# Patient Record
Sex: Male | Born: 1965 | Race: White | Hispanic: No | Marital: Married | Smoking: Former smoker
Health system: Southern US, Community
[De-identification: ages and names within clinical notes are randomized; demographics above are authoritative.]

## PROBLEM LIST (undated history)

## (undated) DIAGNOSIS — F419 Anxiety disorder, unspecified: Secondary | ICD-10-CM

## (undated) DIAGNOSIS — I10 Essential (primary) hypertension: Secondary | ICD-10-CM

## (undated) DIAGNOSIS — G4733 Obstructive sleep apnea (adult) (pediatric): Secondary | ICD-10-CM

## (undated) DIAGNOSIS — M1990 Unspecified osteoarthritis, unspecified site: Secondary | ICD-10-CM

## (undated) DIAGNOSIS — K219 Gastro-esophageal reflux disease without esophagitis: Secondary | ICD-10-CM

## (undated) DIAGNOSIS — E291 Testicular hypofunction: Secondary | ICD-10-CM

## (undated) DIAGNOSIS — Z973 Presence of spectacles and contact lenses: Secondary | ICD-10-CM

## (undated) DIAGNOSIS — E785 Hyperlipidemia, unspecified: Secondary | ICD-10-CM

## (undated) DIAGNOSIS — R7303 Prediabetes: Secondary | ICD-10-CM

## (undated) DIAGNOSIS — S83207A Unspecified tear of unspecified meniscus, current injury, left knee, initial encounter: Secondary | ICD-10-CM

## (undated) DIAGNOSIS — G43909 Migraine, unspecified, not intractable, without status migrainosus: Secondary | ICD-10-CM

## (undated) DIAGNOSIS — E119 Type 2 diabetes mellitus without complications: Secondary | ICD-10-CM

## (undated) DIAGNOSIS — Z9989 Dependence on other enabling machines and devices: Secondary | ICD-10-CM

## (undated) DIAGNOSIS — M109 Gout, unspecified: Secondary | ICD-10-CM

## (undated) DIAGNOSIS — E7439 Other disorders of intestinal carbohydrate absorption: Secondary | ICD-10-CM

## (undated) HISTORY — DX: Essential (primary) hypertension: I10

## (undated) HISTORY — DX: Gastro-esophageal reflux disease without esophagitis: K21.9

## (undated) HISTORY — DX: Migraine, unspecified, not intractable, without status migrainosus: G43.909

## (undated) HISTORY — PX: OTHER SURGICAL HISTORY: SHX169

## (undated) HISTORY — DX: Testicular hypofunction: E29.1

## (undated) HISTORY — PX: KNEE ARTHROSCOPY: SUR90

## (undated) HISTORY — DX: Hyperlipidemia, unspecified: E78.5

---

## 1988-11-14 HISTORY — PX: KNEE ARTHROSCOPY: SUR90

## 1997-08-22 ENCOUNTER — Emergency Department (HOSPITAL_COMMUNITY): Admission: EM | Admit: 1997-08-22 | Discharge: 1997-08-22 | Payer: Self-pay | Admitting: Emergency Medicine

## 1998-03-16 HISTORY — PX: LUMBAR DISC SURGERY: SHX700

## 1999-10-09 ENCOUNTER — Other Ambulatory Visit: Admission: RE | Admit: 1999-10-09 | Discharge: 1999-10-09 | Payer: Self-pay | Admitting: Otolaryngology

## 1999-10-09 ENCOUNTER — Encounter (INDEPENDENT_AMBULATORY_CARE_PROVIDER_SITE_OTHER): Payer: Self-pay | Admitting: Specialist

## 2001-01-12 ENCOUNTER — Encounter: Admission: RE | Admit: 2001-01-12 | Discharge: 2001-01-12 | Payer: Self-pay | Admitting: Family Medicine

## 2001-01-12 ENCOUNTER — Encounter: Payer: Self-pay | Admitting: Family Medicine

## 2004-01-15 ENCOUNTER — Encounter: Admission: RE | Admit: 2004-01-15 | Discharge: 2004-01-15 | Payer: Self-pay | Admitting: Family Medicine

## 2004-01-17 ENCOUNTER — Encounter (INDEPENDENT_AMBULATORY_CARE_PROVIDER_SITE_OTHER): Payer: Self-pay | Admitting: *Deleted

## 2004-01-17 ENCOUNTER — Observation Stay (HOSPITAL_COMMUNITY): Admission: RE | Admit: 2004-01-17 | Discharge: 2004-01-18 | Payer: Self-pay | Admitting: General Surgery

## 2004-01-17 HISTORY — PX: LAPAROSCOPIC CHOLECYSTECTOMY: SUR755

## 2005-05-22 ENCOUNTER — Emergency Department (HOSPITAL_COMMUNITY): Admission: EM | Admit: 2005-05-22 | Discharge: 2005-05-22 | Payer: Self-pay | Admitting: Family Medicine

## 2005-11-24 ENCOUNTER — Ambulatory Visit: Payer: Self-pay | Admitting: Family Medicine

## 2005-11-26 ENCOUNTER — Emergency Department (HOSPITAL_COMMUNITY): Admission: EM | Admit: 2005-11-26 | Discharge: 2005-11-27 | Payer: Self-pay | Admitting: Emergency Medicine

## 2005-11-26 ENCOUNTER — Ambulatory Visit: Payer: Self-pay | Admitting: Family Medicine

## 2006-02-12 ENCOUNTER — Ambulatory Visit: Payer: Self-pay | Admitting: Family Medicine

## 2006-04-22 ENCOUNTER — Ambulatory Visit: Payer: Self-pay | Admitting: Family Medicine

## 2006-05-04 ENCOUNTER — Ambulatory Visit: Payer: Self-pay | Admitting: Family Medicine

## 2006-05-17 ENCOUNTER — Ambulatory Visit: Payer: Self-pay | Admitting: Family Medicine

## 2006-06-22 ENCOUNTER — Ambulatory Visit: Payer: Self-pay | Admitting: Family Medicine

## 2007-01-25 ENCOUNTER — Ambulatory Visit: Payer: Self-pay | Admitting: Family Medicine

## 2007-02-14 ENCOUNTER — Ambulatory Visit: Payer: Self-pay | Admitting: Family Medicine

## 2007-08-29 ENCOUNTER — Ambulatory Visit: Payer: Self-pay | Admitting: Family Medicine

## 2007-09-06 ENCOUNTER — Ambulatory Visit: Payer: Self-pay | Admitting: Family Medicine

## 2008-06-26 ENCOUNTER — Ambulatory Visit: Payer: Self-pay | Admitting: Family Medicine

## 2008-07-28 ENCOUNTER — Ambulatory Visit (HOSPITAL_BASED_OUTPATIENT_CLINIC_OR_DEPARTMENT_OTHER): Admission: RE | Admit: 2008-07-28 | Discharge: 2008-07-28 | Payer: Self-pay | Admitting: Family Medicine

## 2008-08-06 ENCOUNTER — Ambulatory Visit: Payer: Self-pay | Admitting: Pulmonary Disease

## 2008-12-31 ENCOUNTER — Ambulatory Visit: Payer: Self-pay | Admitting: Family Medicine

## 2009-01-01 ENCOUNTER — Encounter: Admission: RE | Admit: 2009-01-01 | Discharge: 2009-01-01 | Payer: Self-pay | Admitting: Family Medicine

## 2009-01-15 ENCOUNTER — Ambulatory Visit: Payer: Self-pay | Admitting: Family Medicine

## 2009-01-24 ENCOUNTER — Encounter: Admission: RE | Admit: 2009-01-24 | Discharge: 2009-01-24 | Payer: Self-pay | Admitting: Family Medicine

## 2009-05-07 ENCOUNTER — Ambulatory Visit: Payer: Self-pay | Admitting: Family Medicine

## 2009-05-09 ENCOUNTER — Ambulatory Visit: Payer: Self-pay | Admitting: Family Medicine

## 2009-12-02 ENCOUNTER — Ambulatory Visit: Payer: Self-pay | Admitting: Family Medicine

## 2010-01-24 ENCOUNTER — Ambulatory Visit: Payer: Self-pay | Admitting: Family Medicine

## 2010-02-24 ENCOUNTER — Ambulatory Visit: Payer: Self-pay | Admitting: Family Medicine

## 2010-04-06 ENCOUNTER — Encounter: Payer: Self-pay | Admitting: Family Medicine

## 2010-04-10 ENCOUNTER — Ambulatory Visit
Admission: RE | Admit: 2010-04-10 | Discharge: 2010-04-10 | Payer: Self-pay | Source: Home / Self Care | Attending: Family Medicine | Admitting: Family Medicine

## 2010-07-29 NOTE — Procedures (Signed)
NAME:  Gerald Larsen, Gerald Larsen NO.:  [id number]   MEDICAL RECORD NO.:  [id number]          PATIENT TYPE:  OUT   LOCATION:  SLEEP CENTER                 FACILITY:  [facility]   PHYSICIAN:  Barbaraann Share, MD,FCCPDATE OF BIRTH:  1964/03/14   DATE OF STUDY:  07/28/2008                            NOCTURNAL POLYSOMNOGRAM   REFERRING PHYSICIAN:  Sharlot Gowda, M.D.   INDICATION FOR STUDY:  Hypersomnia with sleep apnea.   EPWORTH SLEEPINESS SCORE:  9.   MEDICATIONS:   SLEEP ARCHITECTURE:  The patient had a total sleep time of 338 minutes  with no slow wave sleep and only 37 minutes of REM during the entire  night.  Sleep onset latency was normal at 8 minutes, and REM was not  achieved until the titration portion of the study.  Sleep efficiency was  moderately reduced at 81%.   RESPIRATORY DATA:  The patient underwent split-night protocol where he  was found to have 195 obstructive events in the first 162 minutes of  sleep.  This gave him an apnea-hypopnea index of 72 events per hour  during the diagnostic portion of the study.  All of the events occurred  in the supine position, and there was moderate snoring noted throughout.  By protocol, he was then placed on a medium Quattro full face mask, and  ultimately titrated to a final CPAP pressure of 16 cm to control both  obstructive events and snoring.  Tolerance appeared to be excellent.   OXYGEN DATA:  There was O2 desaturation as low as 81% with the patient's  obstructive events.   CARDIAC DATA:  No clinically significant arrhythmias were seen.   MOVEMENT-PARASOMNIA:  There were no abnormal leg jerks or behavior seen.   IMPRESSIONS-RECOMMENDATIONS:  Split-night study revealed severe  obstructive sleep apnea with an apnea-hypopnea index of 72 events per  hour and oxygen desaturation as low as 81% during the diagnostic portion  of the study.  The patient was then placed on continuous positive airway  pressure with a medium  Quattro full face mask, and titrated to an  optimal CPAP pressure of 16 cm.  He did have good control of his  obstructive events through  REM on the final CPAP pressure, however, he did not have any supine  sleep on the final CPAP pressure.  The patient should also be encouraged  to work aggressively on weight loss.      Barbaraann Share, MD,FCCP  Diplomate, American Board of Sleep  Medicine  Electronically Signed     [facility]/MEDQ  D:  08/06/2008 19:43:44  T:  08/07/2008 08:47:17  Job:  [id number]

## 2010-07-31 ENCOUNTER — Telehealth: Payer: Self-pay | Admitting: Family Medicine

## 2010-07-31 NOTE — Telephone Encounter (Signed)
Refill his pain meds

## 2010-08-01 ENCOUNTER — Ambulatory Visit (INDEPENDENT_AMBULATORY_CARE_PROVIDER_SITE_OTHER): Payer: 59 | Admitting: Medical

## 2010-08-01 ENCOUNTER — Encounter: Payer: Self-pay | Admitting: Medical

## 2010-08-01 VITALS — BP 110/72 | HR 76 | Temp 98.9°F | Wt 242.0 lb

## 2010-08-01 DIAGNOSIS — IMO0002 Reserved for concepts with insufficient information to code with codable children: Secondary | ICD-10-CM

## 2010-08-01 DIAGNOSIS — S76219A Strain of adductor muscle, fascia and tendon of unspecified thigh, initial encounter: Secondary | ICD-10-CM

## 2010-08-01 NOTE — Op Note (Signed)
NAME:  Gerald Larsen, Gerald Larsen NO.:  [id number]   MEDICAL RECORD NO.:  [id number]          PATIENT TYPE:  AMB   LOCATION:  DAY                          FACILITY:  [facility]   PHYSICIAN:  Anselm Pancoast. Weatherly, M.D.DATE OF BIRTH:  Dec 21, 1965   DATE OF PROCEDURE:  01/17/2004  DATE OF DISCHARGE:                                 OPERATIVE REPORT   PREOPERATIVE DIAGNOSIS:  Subacute cholecystitis with stones.   POSTOPERATIVE DIAGNOSIS:  Subacute cholecystitis with stones.   OPERATION:  Laparoscopic cholecystectomy with cholangiogram.   SURGEON:  Dr. Consuello Bossier   ASSISTANT:  Nurse.   HISTORY:  Gerald Larsen is a 45 year old male, who is moderately overweight  and has had episodes of epigastric pain off and on over the last year.  Approximately a week ago, he had a severe episode and thought he was having  a cardiac problem, self-treated himself and then saw Dr. Margrett Rud who  did laboratory studies yesterday and then sent him to our office after an  ultrasound which showed gallstones and acutely thickened gallbladder.  The  patient was not febrile, and I saw him yesterday afternoon and could get him  on the OR schedule today.  The patient is moderately overweight but  otherwise in good health.  Did get an EKG today, and it is unremarkable.   The patient was given 3 g of Unasyn.  He has PAS stockings and taken back to  the operative suite.  The abdomen was prepped with Betadine surgical scrub  and solution and draped in a sterile manner.  A small incision was made  below the umbilicus down through approximately 4 inches of adipose tissue.  The fascia was then identified, a small opening made; two Kochers were  placed, and then I carefully opened into the peritoneal cavity.  Pursestring  suture of 0 Vicryl was placed and Hasson cannula introduced.  The patient  did have a tense gallbladder.  It was not acutely hemorrhagic, and the upper  10 mm trocar was placed in the  subxiphoid area after anesthetizing the  fascia, and the two lateral 5 mm trocars were placed at the appropriate  right lateral position.  I then grasped the gallbladder and retracted it  upward in a very steep reverse Trendelenburg with the patient rotated to the  left, could kind of carefully dissect down to the proximal portion of the  gallbladder.  There were kind of fresh adhesions to the surrounding omentum  and duodenum that were swept away carefully.  The gallbladder wall was  edematous, and I dissected it at the most proximal portion and identified  the cystic duct gallbladder junction, placed a hemoclip across it.  Next, a  small opening was made, and the cleared cystic duct just proximal to this,  and a taut catheter introduced, held in place with a clamp, and an x-ray  obtained which showed normal extrahepatic biliary system and about a 2 cm  cystic duct.  The catheter was removed.  The cystic duct was triply clipped  and then divided, and then both the anterior and posterior branches of  the  cystic artery were identified, doubly clipped proximally, singly distally,  and divided, and then the gallbladder was kind of teased from its bed using  the spatula electrocautery.  Good hemostasis was obtained and even though  the patient was very heavy, we would kind of ease the gallbladder free in a  controlled condition and then placed the gallbladder in an EndoCatch bag.  Next, we irrigated, aspirated.  There was no evidence of any bleeding or  bile, and then I put him sort of flat, switched the camera to the upper 10  mm port, and then grasped the bag containing the gallbladder.  The  gallbladder was bagged, was brought up to the skin level, and then slightly  enlarged the fascial opening with a hemostat and a knife so I could play  with the gallbladder as it slipped out with the carbon dioxide, basically  putting the pressure on it.  The bag removed.  I then put an extra figure-of-   eight of 0 Vicryl with a larger needle in the fascia, put a second suture  after the first was tied, and then tied the pursestring.  I then  anesthetized the fascia, inspected, good hemostasis, no evidence of any bile  catching up to the umbilical area, and all the irrigating fluid had been  removed.  I then removed the lateral 5 mm ports, released the carbon  dioxide, and removed the upper 10 mm trocar.  The patient tolerated the  procedure nicely and was sent to the recovery room extubated and breathing  spontaneously.  We will keep him overnight, but he can be released  afterwards and hopefully will have no further problems with this epigastric  pain that has been intermittent for about a year but more severe for the  last 5 days.      WJW/MEDQ  D:  01/17/2004  T:  01/17/2004  Job:  [id number]   cc:   Vale Haven. Andrey Campanile, M.D.  [address]  [place]  [place] [address]  Fax: [phone]

## 2010-08-01 NOTE — Progress Notes (Signed)
Subject of ive:   HPI Here for complaint of possible strain of groin.  He notes that he lifted a garden tiller off the back of his truck today, and when he went to place the tiller on the ground, he felt a sudden pull and pain in his right groin area. He is using no medication, eyes, or heat for his symptoms. He decided to come in for evaluation. No other complaints. Otherwise in normal state of health.  Review of Systems Constitutional: denies fever, chills, sweats,  Dermatology: denies redness, bruising  Cardiology: denies chest pain, palpitations Respiratory: denies cough, shortness of breath Gastroenterology: denies nausea, vomiting, diarrhea, constipation, blood in stool, changes in bowel movement, dysphagia Hematology: denies bleeding or bruising problems Musculoskeletal: denies arthralgias,  joint swelling, back pain Urology: denies dysuria, difficulty urinating, hematuria, urinary frequency, urgency, incontinence Neurology: no weakness, tingling, numbness     Objective:   Physical Exam  General appearance: alert, no distress, WD/WN Heart: RRR, normal S1, S2, no murmurs Lungs: CTA bilaterally, no wheezes, rhonchi, or rales Abdomen: +bs, soft, non tender, non distended Back: non tender Musculoskeletal: Tender along right upper, pain with right/hip range of motion, worse with adduction and flexion of the hip, and worse with resisted hip range of motion on the right in general. Otherwise lower extremities with  no obvious deformity, normal ROM throughout Genitalia: Mild tenderness of the right scrotum, but no obvious mass, no bruising, no erythema, no hernia  Extremities: no edema, no cyanosis, no clubbing Pulses: 2+ symmetric, upper and lower extremities, normal cap refill Neurological:    Assessment & Plan:    Encounter Diagnosis  Name Primary?  . Strain of groin Yes   Advise rest for the next 3 or 4 days, and as things improve, gradually returned to gentle range of  motion, followed by returned to normal range of motion and activity gradually. Advised heat alternate ice and heat, and he will use ibuprofen 600-800 mg over-the-counter every 6-8 hours for the next few days. He already has pain medication at home that he can use if needed (hydrocodone). Call or return if worse or not improving in 3-5 days. He was slightly tender in his right scrotum today. Advised if this worsens, or if he has bruising, cool to touch in, consider going to the emergency department for further evaluation. At this time I do not suspect torsion.

## 2010-08-01 NOTE — Patient Instructions (Signed)
Groin Strain (Adductor Strain, Groin) A groin pull/strain refers to strained muscles on the upper inner part of the thigh. This usually occurs in muscles during exercise or participation in sports events. It is more likely to occur when your muscles are not warmed up well enough or if you are not properly conditioned. A pulled muscle, or muscle strain, occurs when a muscle is over stretched and some muscle fibers are torn. This is especially common in athletes where a sudden violent force placed on a muscle pushes it past its ability to respond. Usually, recovery from a pulled muscle takes 1 to 2 weeks, but complete healing will take 5 to 6 weeks.  HOME CARE INSTRUCTIONS  Apply ice to the sore muscle for 15 minutes every 2-3 hours for the first 2 days. Put ice in a plastic bag and place a towel between the bag of ice and your skin.   Do not strain the pulled muscle for as long as it is still painful.   Only take over-the-counter or prescription medicines for pain, discomfort, or fever as directed by your caregiver. Do not use aspirin as this may increase bleeding (bruising) at injury site.   Warming up before exercise and developing proper conditioning helps prevent muscle strains.  SEEK IMMEDIATE MEDICAL CARE IF:   There is increased pain or swelling in the affected area.   You are not improving or are getting worse.  Document Released: 10/29/2003 Document Re-Released: 05/29/2008 [facility] Patient Information 2011 [place], [place].

## 2010-08-15 ENCOUNTER — Other Ambulatory Visit: Payer: Self-pay | Admitting: Family Medicine

## 2010-08-15 MED ORDER — HYDROCODONE-ACETAMINOPHEN 5-500 MG PO TABS: 1.0000 | ORAL_TABLET | Freq: Four times a day (QID) | ORAL | Status: DC | PRN

## 2010-09-29 ENCOUNTER — Other Ambulatory Visit: Payer: Self-pay

## 2010-09-29 MED ORDER — METFORMIN HCL 500 MG PO TABS: 500.0000 mg | ORAL_TABLET | ORAL | Status: DC

## 2010-11-03 ENCOUNTER — Other Ambulatory Visit: Payer: Self-pay

## 2010-11-03 MED ORDER — METFORMIN HCL ER 500 MG PO TB24: 500.0000 mg | ORAL_TABLET | Freq: Every day | ORAL | Status: DC

## 2010-11-03 MED ORDER — LISINOPRIL-HYDROCHLOROTHIAZIDE 20-12.5 MG PO TABS: 2.0000 | ORAL_TABLET | Freq: Every day | ORAL | Status: DC

## 2011-01-09 ENCOUNTER — Telehealth: Payer: Self-pay | Admitting: Medical

## 2011-01-12 NOTE — Telephone Encounter (Signed)
Looking back in chart, he is due for routine chronic disease f/u.   He needs appt prior to refill.

## 2011-01-13 ENCOUNTER — Ambulatory Visit (INDEPENDENT_AMBULATORY_CARE_PROVIDER_SITE_OTHER): Payer: 59 | Admitting: Medical

## 2011-01-13 ENCOUNTER — Encounter: Payer: Self-pay | Admitting: Medical

## 2011-01-13 VITALS — BP 130/80 | HR 80 | Temp 98.1°F | Resp 18 | Wt 244.0 lb

## 2011-01-13 DIAGNOSIS — E1169 Type 2 diabetes mellitus with other specified complication: Secondary | ICD-10-CM | POA: Insufficient documentation

## 2011-01-13 DIAGNOSIS — J40 Bronchitis, not specified as acute or chronic: Secondary | ICD-10-CM

## 2011-01-13 DIAGNOSIS — Z23 Encounter for immunization: Secondary | ICD-10-CM

## 2011-01-13 DIAGNOSIS — R7302 Impaired glucose tolerance (oral): Secondary | ICD-10-CM | POA: Insufficient documentation

## 2011-01-13 DIAGNOSIS — G43909 Migraine, unspecified, not intractable, without status migrainosus: Secondary | ICD-10-CM | POA: Insufficient documentation

## 2011-01-13 DIAGNOSIS — E119 Type 2 diabetes mellitus without complications: Secondary | ICD-10-CM

## 2011-01-13 DIAGNOSIS — E785 Hyperlipidemia, unspecified: Secondary | ICD-10-CM

## 2011-01-13 DIAGNOSIS — I10 Essential (primary) hypertension: Secondary | ICD-10-CM | POA: Insufficient documentation

## 2011-01-13 LAB — POCT URINALYSIS DIPSTICK
Bilirubin, UA: NEGATIVE
Blood, UA: NEGATIVE
Glucose, UA: NEGATIVE
Ketones, UA: NEGATIVE
Leukocytes, UA: NEGATIVE
Nitrite, UA: NEGATIVE
Spec Grav, UA: 1.005
Urobilinogen, UA: NEGATIVE
pH, UA: 6

## 2011-01-13 LAB — LIPID PANEL
Cholesterol: 178 mg/dL (ref 0–200)
HDL: 61 mg/dL (ref >39)
LDL Cholesterol: 84 mg/dL (ref 0–99)
Total CHOL/HDL Ratio: 2.9 Ratio
Triglycerides: 163 mg/dL — ABNORMAL HIGH (ref <150)
VLDL: 33 mg/dL (ref 0–40)

## 2011-01-13 LAB — CBC WITH DIFFERENTIAL/PLATELET
Basophils Absolute: 0 10*3/uL (ref 0.0–0.1)
Basophils Relative: 0 % (ref 0–1)
Eosinophils Absolute: 0.1 10*3/uL (ref 0.0–0.7)
Eosinophils Relative: 1 % (ref 0–5)
HCT: 50.6 % (ref 39.0–52.0)
Hemoglobin: 17.3 g/dL — ABNORMAL HIGH (ref 13.0–17.0)
Lymphocytes Relative: 20 % (ref 12–46)
Lymphs Abs: 2.2 10*3/uL (ref 0.7–4.0)
MCH: 30.1 pg (ref 26.0–34.0)
MCHC: 34.2 g/dL (ref 30.0–36.0)
MCV: 88.2 fL (ref 78.0–100.0)
Monocytes Absolute: 0.7 10*3/uL (ref 0.1–1.0)
Monocytes Relative: 6 % (ref 3–12)
Neutro Abs: 7.7 10*3/uL (ref 1.7–7.7)
Neutrophils Relative %: 72 % (ref 43–77)
Platelets: 276 10*3/uL (ref 150–400)
RBC: 5.74 MIL/uL (ref 4.22–5.81)
RDW: 14.1 % (ref 11.5–15.5)
WBC: 10.8 10*3/uL — ABNORMAL HIGH (ref 4.0–10.5)

## 2011-01-13 LAB — COMPREHENSIVE METABOLIC PANEL
ALT: 43 U/L (ref 0–53)
AST: 29 U/L (ref 0–37)
Albumin: 4.5 g/dL (ref 3.5–5.2)
Alkaline Phosphatase: 49 U/L (ref 39–117)
BUN: 15 mg/dL (ref 6–23)
CO2: 26 mEq/L (ref 19–32)
Calcium: 10.4 mg/dL (ref 8.4–10.5)
Chloride: 104 mEq/L (ref 96–112)
Creat: 1.1 mg/dL (ref 0.50–1.35)
Glucose, Bld: 124 mg/dL — ABNORMAL HIGH (ref 70–99)
Potassium: 4 mEq/L (ref 3.5–5.3)
Sodium: 140 mEq/L (ref 135–145)
Total Bilirubin: 0.5 mg/dL (ref 0.3–1.2)
Total Protein: 7.2 g/dL (ref 6.0–8.3)

## 2011-01-13 MED ORDER — HYDROCODONE-ACETAMINOPHEN 5-500 MG PO TABS: 1.0000 | ORAL_TABLET | Freq: Four times a day (QID) | ORAL | Status: DC | PRN

## 2011-01-13 MED ORDER — AZITHROMYCIN 250 MG PO TABS: ORAL_TABLET | ORAL | Status: AC

## 2011-01-13 NOTE — Progress Notes (Signed)
Subjective:   HPI  Gerald Larsen is a 45 y.o. male who presents for multiple issues.  He is here at my request after recent refill request.  He normally sees Dr. Susann Givens for care.  He was last here in May for groin injury and saw me, but hasn't been here since December 2011 for routine f/u.  He is fasting today.  He has hx/o significant for diabetes, blood pressure, hyperlipidemia.  He is compliant with all of his medications.  He check glucose daily, gets numbers in the 80s.  Denies hypoglycemia, no recent problems.  He is checking feet daily.   He tries to eat healthy.  He is up to date within past year for dental and eye exams.  He is exercising with activity at work on the job.  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  He hasn't had his flu shot this year.  No prior pneumococcal vaccine.     He also has hx/o migraines.  He notes hx/o evaluations by over 5 different neurologists and headache specialist and says they were all "quacks."  He has been under Dr. Jola Babinski care since 2007 for headaches.   He current tries to avoid triggers, but uses Hydrocodone prn about 3 times per week.  Been on this regimen for years.  Has been on multiple prior acute and preventative medications.    He recently started getting sick. He notes last few days with headache, sore throat, runny nose, chest congestion, subjective fever, sweats, rattly chest.  Using OTC cough syrup.  His wife and daughter are sick, his wife saw me yesterday for illness.  He is feeling worse today.  He is going out of town today to help the [employer] with the [employer].    No other c/o.   The following portions of the patient's history were reviewed and updated as appropriate: allergies, current medications, past family history, past medical history, past social history, past surgical history and problem list.  No Known Allergies   Past Medical History  Diagnosis Date  . Diabetes mellitus   .  Hypertension   . GERD (gastroesophageal reflux disease)   . Obstructive sleep apnea   . Microalbuminuria   . Hypogonadism male   . Obesity   . Migraines   . Hyperlipidemia     Past Surgical History  Procedure Date  . Cholecystectomy   . Spine surgery    Review of Systems Constitutional: +fever, -chills, -sweats, -unexpected -weight change,-fatigue ENT: +runny nose, -ear pain, +sore throat Cardiology:  -chest pain, -palpitations, -edema Respiratory: +cough, -shortness of breath, -wheezing Gastroenterology: -abdominal pain, -nausea, -vomiting, +diarrhea, -constipation Hematology: -bleeding or bruising problems Musculoskeletal: -arthralgias, -myalgias, -joint swelling, -back pain Ophthalmology: -vision changes Urology: -dysuria, -difficulty urinating, -hematuria, -urinary frequency, -urgency Neurology: +headache, -weakness, -tingling, -numbness   Objective:   Physical Exam  Filed Vitals:   01/13/11 0931  BP: 130/80  Pulse: 80  Temp: 98.1 F (36.7 C)  Resp: 18    General appearance: alert, no distress, WD/WN, overweight white male Skin: no foot lesions HEENT: normocephalic, sclerae anicteric, PERRLA, EOMi, nares with purulent discharge, pharynx normal Oral cavity: MMM, no lesions Neck: supple, no lymphadenopathy, no thyromegaly, no masses, no bruits Heart: RRR, normal S1, S2, no murmurs Lungs: CTA bilaterally, no wheezes, rhonchi, or rales Abdomen: +bs, soft, non tender, non distended, no masses, no hepatomegaly, no splenomegaly Extremities: no edema, no cyanosis, no clubbing Pulses: 2+ symmetric, upper and lower extremities, normal cap refill  Neurological: feet with normal sensation, monofilament exam normal, CN2-12 intact, nonfocal exam   Assessment and Plan :    Encounter Diagnoses  Name Primary?  . Bronchitis Yes  . Type II or unspecified type diabetes mellitus without mention of complication, not stated as uncontrolled   . Hyperlipidemia   . Essential  hypertension, benign   . Migraine   . Need for prophylactic vaccination and inoculation against influenza   . Need for pneumococcal vaccination    Bronchitis - script for Zpak, rest, hydrate well, call if not improving.  DM type II - labs today, updated flu and pneumococcal vaccines.  C/t daily glucose monitoring, diabetic diet, daily exercise, daily foot checks.  He is UTD on eye and dental exams.    Hyperlipidemia - c/t low fat low chol diet.  C/t same meds.   Labs today.  HTN - controlled on current medication  Migraine - relatively good control with trigger avoidance and Lortab use prn, on average 1 tablet 3 times per week.  Long term therapy stable with Dr. Susann Givens, supervising physician.

## 2011-01-13 NOTE — Progress Notes (Signed)
  Subjective:    Patient ID: Gerald Larsen, male    DOB: 24-Oct-1965, 45 y.o.   MRN: [id number]  HPI    Review of Systems     Objective:   Physical Exam        Assessment & Plan:

## 2011-01-14 ENCOUNTER — Other Ambulatory Visit: Payer: Self-pay | Admitting: Medical

## 2011-01-14 LAB — HEMOGLOBIN A1C
Hgb A1c MFr Bld: 5.6 % (ref <5.7)
Mean Plasma Glucose: 114 mg/dL (ref <117)

## 2011-01-14 MED ORDER — PRAVASTATIN SODIUM 80 MG PO TABS: 80.0000 mg | ORAL_TABLET | Freq: Every evening | ORAL | Status: DC

## 2011-02-03 ENCOUNTER — Encounter: Payer: Self-pay | Admitting: Family Medicine

## 2011-02-26 ENCOUNTER — Other Ambulatory Visit: Payer: Self-pay | Admitting: Family Medicine

## 2011-03-17 HISTORY — PX: CARDIAC CATHETERIZATION: SHX172

## 2011-03-29 ENCOUNTER — Other Ambulatory Visit: Payer: Self-pay | Admitting: Family Medicine

## 2011-05-06 ENCOUNTER — Other Ambulatory Visit: Payer: Self-pay | Admitting: Family Medicine

## 2011-06-01 ENCOUNTER — Other Ambulatory Visit: Payer: Self-pay | Admitting: Family Medicine

## 2011-06-18 ENCOUNTER — Other Ambulatory Visit: Payer: Self-pay | Admitting: Medical

## 2011-06-18 ENCOUNTER — Telehealth: Payer: Self-pay | Admitting: Internal Medicine

## 2011-06-18 MED ORDER — HYDROCODONE-ACETAMINOPHEN 5-500 MG PO TABS: 1.0000 | ORAL_TABLET | Freq: Four times a day (QID) | ORAL | Status: DC | PRN

## 2011-06-18 NOTE — Telephone Encounter (Signed)
Had to reorder the med cause [facility] [place]. Called in med to pharmacy

## 2011-06-18 NOTE — Telephone Encounter (Signed)
Call out the hydrocodone 5/500.  See refill info in medications.  2 refills.

## 2011-07-21 ENCOUNTER — Encounter: Payer: Self-pay | Admitting: Internal Medicine

## 2011-07-21 LAB — HM DIABETES EYE EXAM

## 2011-07-30 ENCOUNTER — Other Ambulatory Visit: Payer: Self-pay

## 2011-07-30 MED ORDER — OMEPRAZOLE 40 MG PO CPDR: 40.0000 mg | DELAYED_RELEASE_CAPSULE | Freq: Every day | ORAL | Status: DC

## 2011-08-05 ENCOUNTER — Other Ambulatory Visit: Payer: Self-pay | Admitting: Family Medicine

## 2011-08-05 DIAGNOSIS — G43909 Migraine, unspecified, not intractable, without status migrainosus: Secondary | ICD-10-CM

## 2011-08-06 ENCOUNTER — Other Ambulatory Visit: Payer: Self-pay

## 2011-08-06 MED ORDER — HYDROCODONE-ACETAMINOPHEN 5-500 MG PO TABS: 1.0000 | ORAL_TABLET | Freq: Four times a day (QID) | ORAL | Status: DC | PRN

## 2011-08-06 NOTE — Telephone Encounter (Signed)
Called med in per jcl 

## 2011-08-06 NOTE — Progress Notes (Signed)
Renew this medication 

## 2011-08-11 ENCOUNTER — Encounter: Payer: Self-pay | Admitting: Family Medicine

## 2011-08-11 ENCOUNTER — Ambulatory Visit (INDEPENDENT_AMBULATORY_CARE_PROVIDER_SITE_OTHER): Payer: 59 | Admitting: Family Medicine

## 2011-08-11 VITALS — BP 110/70 | HR 79 | Wt 231.0 lb

## 2011-08-11 DIAGNOSIS — S61012A Laceration without foreign body of left thumb without damage to nail, initial encounter: Secondary | ICD-10-CM

## 2011-08-11 DIAGNOSIS — Z23 Encounter for immunization: Secondary | ICD-10-CM

## 2011-08-11 DIAGNOSIS — S61209A Unspecified open wound of unspecified finger without damage to nail, initial encounter: Secondary | ICD-10-CM

## 2011-08-11 NOTE — Progress Notes (Signed)
  Subjective:    Patient ID: Gerald Larsen, male    DOB: 06-Feb-1966, 46 y.o.   MRN: [id number]  HPI He sustained a laceration to the base of the left thumb earlier today. He is here for evaluation.   Review of Systems     Objective:   Physical Exam A horizontal 2 cm laceration is noted at the base of the left thumb. Sensation and motion are normal.       Assessment & Plan:   1. Immunization due   2. Thumb laceration, left, initial encounter    ear was injected with Xylocaine, cleaned with Betadine and one 5-0 Ethilon suture was placed. He is to keep the area clean and dry and return here in one week. He is up-to-date on his immunizations.

## 2011-08-12 NOTE — Progress Notes (Signed)
Addended by: Ronnald Nian on: 08/12/2011 01:13 PM   Modules accepted: Orders

## 2011-08-13 NOTE — Progress Notes (Signed)
  Subjective:    Patient ID: Gerald Larsen, male    DOB: 1966-03-01, 46 y.o.   MRN: [id number]  HPI    Review of Systems     Objective:   Physical Exam        Assessment & Plan:  2 cc of Xylocaine was injected

## 2011-08-18 ENCOUNTER — Ambulatory Visit: Payer: 59 | Admitting: Family Medicine

## 2011-08-19 ENCOUNTER — Ambulatory Visit (INDEPENDENT_AMBULATORY_CARE_PROVIDER_SITE_OTHER): Payer: 59 | Admitting: Family Medicine

## 2011-08-19 DIAGNOSIS — S61209A Unspecified open wound of unspecified finger without damage to nail, initial encounter: Secondary | ICD-10-CM

## 2011-08-19 DIAGNOSIS — S61011S Laceration without foreign body of right thumb without damage to nail, sequela: Secondary | ICD-10-CM

## 2011-08-19 NOTE — Progress Notes (Signed)
  Subjective:    Patient ID: Gerald Larsen, male    DOB: 21-Sep-1965, 46 y.o.   MRN: [id number]  HPI He is here for a recheck. The suture was accidentally snagged and removed while he was cleaning a tub.   Review of Systems     Objective:   Physical Exam The wound is healing well with no evidence of dehiscence site or infection      Assessment & Plan:  Hand laceration, now healed. Return here as needed

## 2011-10-18 ENCOUNTER — Other Ambulatory Visit: Payer: Self-pay | Admitting: Family Medicine

## 2011-10-22 ENCOUNTER — Telehealth: Payer: Self-pay | Admitting: Family Medicine

## 2011-10-22 MED ORDER — HYDROCODONE-ACETAMINOPHEN 5-500 MG PO TABS: 1.0000 | ORAL_TABLET | Freq: Three times a day (TID) | ORAL | Status: AC | PRN

## 2011-10-22 NOTE — Telephone Encounter (Signed)
Renew the hydrocodone

## 2011-10-22 NOTE — Telephone Encounter (Signed)
Pt called he is out of his migraine medication  Hydrocodone 5/500    [facility]

## 2011-10-22 NOTE — Telephone Encounter (Signed)
MED CALLED IN

## 2011-11-25 ENCOUNTER — Other Ambulatory Visit: Payer: Self-pay | Admitting: Family Medicine

## 2011-11-30 ENCOUNTER — Telehealth: Payer: Self-pay | Admitting: Internal Medicine

## 2011-11-30 MED ORDER — OMEPRAZOLE 40 MG PO CPDR: 40.0000 mg | DELAYED_RELEASE_CAPSULE | Freq: Every day | ORAL | Status: DC

## 2011-11-30 NOTE — Telephone Encounter (Signed)
[facility] SENT IN

## 2011-11-30 NOTE — Telephone Encounter (Signed)
Refill request for omeprazole Dr 40mg  # 30 to [facility]

## 2012-02-06 ENCOUNTER — Other Ambulatory Visit: Payer: Self-pay | Admitting: Family Medicine

## 2012-02-18 ENCOUNTER — Other Ambulatory Visit: Payer: Self-pay | Admitting: Medical

## 2012-02-18 NOTE — Telephone Encounter (Signed)
PATIENT NEEDS A OFFICE VISIT BEFORE HE RECEIVES ANYMORE REFILL ON HIS MEDICATION.

## 2012-03-07 ENCOUNTER — Other Ambulatory Visit: Payer: Self-pay | Admitting: Family Medicine

## 2012-03-07 NOTE — Telephone Encounter (Signed)
Called in.

## 2012-03-07 NOTE — Telephone Encounter (Signed)
Is this ok?

## 2012-03-07 NOTE — Telephone Encounter (Signed)
Call in the Vicodin

## 2012-03-25 ENCOUNTER — Other Ambulatory Visit: Payer: Self-pay | Admitting: Medical

## 2012-04-13 ENCOUNTER — Other Ambulatory Visit: Payer: Self-pay | Admitting: Medical

## 2012-04-14 ENCOUNTER — Other Ambulatory Visit: Payer: Self-pay | Admitting: *Deleted

## 2012-04-14 NOTE — Telephone Encounter (Signed)
Called patient to let him know that I received a refill request for his pravastatin this morning and that I am unable to refill until he calls and schedules a fasting med check with Vincenza Hews. His also lipid panel was done in 12/2010 and is in need of a med check as well. Once he calls and gets an appt scheduled I will be able to call in enough meds to last him until that appointment.

## 2012-04-18 ENCOUNTER — Other Ambulatory Visit: Payer: Self-pay | Admitting: Medical

## 2012-04-18 MED ORDER — PRAVASTATIN SODIUM 40 MG PO TABS: 80.0000 mg | ORAL_TABLET | Freq: Two times a day (BID) | ORAL | Status: DC

## 2012-04-18 NOTE — Telephone Encounter (Signed)
Patient is aware that he needs a medication check appointment . His appointment is on 04/20/12 at 830 am. I sent the medication refill in for 1 month only. CLS

## 2012-04-20 ENCOUNTER — Encounter: Payer: 59 | Admitting: Medical

## 2012-05-03 ENCOUNTER — Other Ambulatory Visit: Payer: Self-pay | Admitting: Family Medicine

## 2012-05-03 NOTE — Telephone Encounter (Signed)
Okay to renew

## 2012-05-03 NOTE — Telephone Encounter (Signed)
Is this ok?

## 2012-06-16 ENCOUNTER — Other Ambulatory Visit: Payer: Self-pay | Admitting: Family Medicine

## 2012-07-22 ENCOUNTER — Telehealth: Payer: Self-pay | Admitting: Internal Medicine

## 2012-07-22 MED ORDER — LISINOPRIL-HYDROCHLOROTHIAZIDE 20-12.5 MG PO TABS: 2.0000 | ORAL_TABLET | Freq: Every day | ORAL | Status: DC

## 2012-07-22 MED ORDER — METFORMIN HCL ER 500 MG PO TB24: 500.0000 mg | ORAL_TABLET | Freq: Every day | ORAL | Status: DC

## 2012-07-22 MED ORDER — PRAVASTATIN SODIUM 40 MG PO TABS: 40.0000 mg | ORAL_TABLET | Freq: Two times a day (BID) | ORAL | Status: DC

## 2012-07-22 MED ORDER — OMEPRAZOLE 40 MG PO CPDR: 40.0000 mg | DELAYED_RELEASE_CAPSULE | Freq: Every day | ORAL | Status: DC

## 2012-07-22 NOTE — Telephone Encounter (Signed)
Filled all 4 meds to get him to his appt 6/17

## 2012-08-30 ENCOUNTER — Encounter: Payer: Self-pay | Admitting: Family Medicine

## 2012-09-27 ENCOUNTER — Ambulatory Visit (INDEPENDENT_AMBULATORY_CARE_PROVIDER_SITE_OTHER): Payer: No Typology Code available for payment source | Admitting: Medical

## 2012-09-27 ENCOUNTER — Ambulatory Visit
Admission: RE | Admit: 2012-09-27 | Discharge: 2012-09-27 | Disposition: A | Discharge status: Home / Self Care | Payer: No Typology Code available for payment source | Source: Ambulatory Visit | Attending: Medical | Admitting: Medical

## 2012-09-27 ENCOUNTER — Other Ambulatory Visit: Payer: Self-pay | Admitting: Family Medicine

## 2012-09-27 ENCOUNTER — Ambulatory Visit: Payer: Self-pay | Admitting: Medical

## 2012-09-27 ENCOUNTER — Encounter: Payer: Self-pay | Admitting: Medical

## 2012-09-27 ENCOUNTER — Other Ambulatory Visit: Payer: No Typology Code available for payment source

## 2012-09-27 VITALS — BP 132/82 | HR 72 | Temp 98.2°F | Resp 16 | Wt 243.0 lb

## 2012-09-27 DIAGNOSIS — S20219A Contusion of unspecified front wall of thorax, initial encounter: Secondary | ICD-10-CM

## 2012-09-27 DIAGNOSIS — R0781 Pleurodynia: Secondary | ICD-10-CM

## 2012-09-27 DIAGNOSIS — R071 Chest pain on breathing: Secondary | ICD-10-CM

## 2012-09-27 DIAGNOSIS — S20211A Contusion of right front wall of thorax, initial encounter: Secondary | ICD-10-CM

## 2012-09-27 DIAGNOSIS — E119 Type 2 diabetes mellitus without complications: Secondary | ICD-10-CM

## 2012-09-27 DIAGNOSIS — E785 Hyperlipidemia, unspecified: Secondary | ICD-10-CM

## 2012-09-27 LAB — COMPREHENSIVE METABOLIC PANEL
ALT: 23 U/L (ref 0–53)
AST: 19 U/L (ref 0–37)
Albumin: 4 g/dL (ref 3.5–5.2)
Alkaline Phosphatase: 51 U/L (ref 39–117)
BUN: 15 mg/dL (ref 6–23)
CO2: 29 mEq/L (ref 19–32)
Calcium: 9.6 mg/dL (ref 8.4–10.5)
Chloride: 104 mEq/L (ref 96–112)
Creat: 1.12 mg/dL (ref 0.50–1.35)
Glucose, Bld: 97 mg/dL (ref 70–99)
Potassium: 4.3 mEq/L (ref 3.5–5.3)
Sodium: 139 mEq/L (ref 135–145)
Total Bilirubin: 0.4 mg/dL (ref 0.3–1.2)
Total Protein: 6.3 g/dL (ref 6.0–8.3)

## 2012-09-27 LAB — CBC WITH DIFFERENTIAL/PLATELET
Basophils Absolute: 0 10*3/uL (ref 0.0–0.1)
Basophils Relative: 1 % (ref 0–1)
Eosinophils Absolute: 0.1 10*3/uL (ref 0.0–0.7)
Eosinophils Relative: 3 % (ref 0–5)
HCT: 42.2 % (ref 39.0–52.0)
Hemoglobin: 13.9 g/dL (ref 13.0–17.0)
Lymphocytes Relative: 26 % (ref 12–46)
Lymphs Abs: 1.4 10*3/uL (ref 0.7–4.0)
MCH: 28.3 pg (ref 26.0–34.0)
MCHC: 32.9 g/dL (ref 30.0–36.0)
MCV: 85.9 fL (ref 78.0–100.0)
Monocytes Absolute: 0.4 10*3/uL (ref 0.1–1.0)
Monocytes Relative: 7 % (ref 3–12)
Neutro Abs: 3.3 10*3/uL (ref 1.7–7.7)
Neutrophils Relative %: 63 % (ref 43–77)
Platelets: 211 10*3/uL (ref 150–400)
RBC: 4.91 MIL/uL (ref 4.22–5.81)
RDW: 14.4 % (ref 11.5–15.5)
WBC: 5.3 10*3/uL (ref 4.0–10.5)

## 2012-09-27 LAB — HEMOGLOBIN A1C
Hgb A1c MFr Bld: 5.3 % (ref <5.7)
Mean Plasma Glucose: 105 mg/dL (ref <117)

## 2012-09-27 LAB — LIPID PANEL
Cholesterol: 170 mg/dL (ref 0–200)
HDL: 65 mg/dL (ref >39)
LDL Cholesterol: 90 mg/dL (ref 0–99)
Total CHOL/HDL Ratio: 2.6 Ratio
Triglycerides: 75 mg/dL (ref <150)
VLDL: 15 mg/dL (ref 0–40)

## 2012-09-27 MED ORDER — OXYCODONE-ACETAMINOPHEN 5-325 MG PO TABS: 1.0000 | ORAL_TABLET | Freq: Three times a day (TID) | ORAL | Status: DC | PRN

## 2012-09-27 NOTE — Progress Notes (Signed)
Subjective: Here for injury.  5 days ago was cleaning a garden tub.  While leaning over the edge of the tub, heard and felt pop of ribs.  Had sharp stabbing pain in right side.  Hurst with twisting and hip flexion movements.  Can't sleep well, can't get comfortable.  Localized pain in front ribs on the right.   Using some motrin for relief.  Pain with deep breaths.  Can't laugh due to the pain.  No prior hx/o rib cage or abdominal trauma.  No prior surgeries in abdomen or chest.  Thinks he has a bruised rib, wants relief of the pain.    Past Medical History  Diagnosis Date  . Diabetes mellitus   . Hypertension   . GERD (gastroesophageal reflux disease)   . Obstructive sleep apnea   . Microalbuminuria   . Hypogonadism male   . Obesity   . Migraines   . Hyperlipidemia    ROS as in subjective  Objective: Gen: wd, wn, nad Lungs: pain with breathing, but clear, no wheezes, no rhonchi, no rales Back and posterior thorax nontender Chest: point tender right lower anterior ribs, pain with inspiration and expiration, no obvious flail chest Abdomen: +bs, soft, nontender, no mass  Assessment: Encounter Diagnoses  Name Primary?  . Rib contusion, right, initial encounter Yes  . Pleuritic chest pain    Plan: Will send for rib xray.  Percocet script for pain control.  discussed diagnosis of rib contusion, usual time frame for healing, things that aggravate the pain, may alternate ice/heat pad, can c/t Ibuprofen.  F/u pending xray.

## 2012-09-29 ENCOUNTER — Ambulatory Visit (INDEPENDENT_AMBULATORY_CARE_PROVIDER_SITE_OTHER): Payer: No Typology Code available for payment source | Admitting: Family Medicine

## 2012-09-29 ENCOUNTER — Encounter: Payer: Self-pay | Admitting: Gastroenterology

## 2012-09-29 ENCOUNTER — Encounter: Payer: Self-pay | Admitting: Family Medicine

## 2012-09-29 VITALS — BP 130/90 | HR 71 | Ht 71.0 in | Wt 242.0 lb

## 2012-09-29 DIAGNOSIS — Z0000 Encounter for general adult medical examination without abnormal findings: Secondary | ICD-10-CM

## 2012-09-29 DIAGNOSIS — G4730 Sleep apnea, unspecified: Secondary | ICD-10-CM

## 2012-09-29 DIAGNOSIS — R7302 Impaired glucose tolerance (oral): Secondary | ICD-10-CM

## 2012-09-29 DIAGNOSIS — E669 Obesity, unspecified: Secondary | ICD-10-CM

## 2012-09-29 DIAGNOSIS — E785 Hyperlipidemia, unspecified: Secondary | ICD-10-CM

## 2012-09-29 DIAGNOSIS — R7309 Other abnormal glucose: Secondary | ICD-10-CM

## 2012-09-29 DIAGNOSIS — E291 Testicular hypofunction: Secondary | ICD-10-CM

## 2012-09-29 DIAGNOSIS — I10 Essential (primary) hypertension: Secondary | ICD-10-CM

## 2012-09-29 DIAGNOSIS — K219 Gastro-esophageal reflux disease without esophagitis: Secondary | ICD-10-CM

## 2012-09-29 DIAGNOSIS — G43909 Migraine, unspecified, not intractable, without status migrainosus: Secondary | ICD-10-CM

## 2012-09-29 DIAGNOSIS — R195 Other fecal abnormalities: Secondary | ICD-10-CM

## 2012-09-29 LAB — HEMOCCULT GUIAC POC 1CARD (OFFICE)

## 2012-09-29 LAB — PSA: PSA: 0.16 ng/mL (ref <=4.00)

## 2012-09-29 MED ORDER — OMEPRAZOLE 40 MG PO CPDR: 40.0000 mg | DELAYED_RELEASE_CAPSULE | Freq: Every day | ORAL | Status: DC

## 2012-09-29 MED ORDER — LISINOPRIL-HYDROCHLOROTHIAZIDE 20-12.5 MG PO TABS: 2.0000 | ORAL_TABLET | Freq: Every day | ORAL | Status: DC

## 2012-09-29 MED ORDER — METFORMIN HCL ER 500 MG PO TB24: 500.0000 mg | ORAL_TABLET | Freq: Every day | ORAL | Status: DC

## 2012-09-29 MED ORDER — PRAVASTATIN SODIUM 40 MG PO TABS: 40.0000 mg | ORAL_TABLET | Freq: Every day | ORAL | Status: DC

## 2012-09-29 NOTE — Progress Notes (Signed)
  Subjective:    Patient ID: Gerald Larsen, male    DOB: [birth year], 47 y.o.   MRN: [id number]  HPI He is here for complete examination. He recently was treated for rib fracture and is feeling better with this. He continues on his blood pressure medications without difficulty. He does also take Pravachol. He is taking metformin however review his record indicates the highest hemoglobin A1c was 6.1. His migraines are under good control. He uses his CPAP machine regularly. He requires Prilosec daily to keep his reflux under good control. He does have a previous history of hypogonadism but stopped the medication due to cost and lack of insurance. He does complain of decreased libido. His work and home life are going well. Family and social history were reviewed.   Review of Systems Negative except as above    Objective:   Physical Exam BP 130/90  Pulse 71  Ht 5\' 11"  (1.803 m)  Wt 242 lb (109.77 kg)  BMI 33.77 kg/m2  General Appearance:    Alert, cooperative, no distress, appears stated age  Head:    Normocephalic, without obvious abnormality, atraumatic  Eyes:    PERRL, conjunctiva/corneas clear, EOM's intact, fundi    benign  Ears:    Normal TM's and external ear canals  Nose:   Nares normal, mucosa normal, no drainage or sinus   tenderness  Throat:   Lips, mucosa, and tongue normal; teeth and gums normal  Neck:   Supple, no lymphadenopathy;  thyroid:  no   enlargement/tenderness/nodules; no carotid   bruit or JVD  Back:    Spine nontender, no curvatOperativeure, ROM normal, no CVA     tenderness  Lungs:     Clear to auscultation bilaterally without wheezes, rales or     ronchi; respirations unlabored  Chest Wall:    No tenderness or deformity   Heart:    Regular rate and rhythm, S1 and S2 normal, no murmur, rub   or gallop  Breast Exam:    No chest wall tenderness, masses or gynecomastia  Abdomen:     Soft, non-tender, nondistended, normoactive bowel sounds,    no masses, no  hepatosplenomegaly  Genitalia:    Normal male external genitalia without lesions.  Testicles without masses.  No inguinal hernias.  Rectal:    Normal sphincter tone, no masses or tenderness; guaiac positive stool.  Prostate smooth, no nodules, not enlarged.  Extremities:   No clubbing, cyanosis or edema  Pulses:   2+ and symmetric all extremities  Skin:   Skin color, texture, turgor normal, no rashes or lesions  Lymph nodes:   Cervical, supraclavicular, and axillary nodes normal  Neurologic:   CNII-XII intact, normal strength, sensation and gait; reflexes 2+ and symmetric throughout          Psych:   Normal mood, affect, hygiene and grooming.          Assessment & Plan:  Hypogonadism male  Essential hypertension, benign - Plan: lisinopril-hydrochlorothiazide (PRINZIDE,ZESTORETIC) 20-12.5 MG per tablet  Hyperlipidemia - Plan: pravastatin (PRAVACHOL) 40 MG tablet  Migraine  Sleep apnea  GERD (gastroesophageal reflux disease) - Plan: omeprazole (PRILOSEC) 40 MG capsule  Obesity (BMI 30.0-34.9)  Routine general medical examination at a health care facility - Plan: Hemoccult - 1 Card (office)  Glucose intolerance (impaired glucose tolerance) - Plan: metFORMIN (GLUCOPHAGE-XR) 500 MG 24 hr tablet  Stool guaiac positive - Plan: Ambulatory referral to Gastroenterology  I will also get a CPAP readout

## 2012-09-30 LAB — TESTOSTERONE: Testosterone: 347 ng/dL (ref 300–890)

## 2012-09-30 NOTE — Progress Notes (Signed)
Quick Note:  Mailed pt letter of lab results ______ 

## 2012-10-17 ENCOUNTER — Encounter: Payer: Self-pay | Admitting: *Deleted

## 2012-10-17 NOTE — Progress Notes (Signed)
Patient ID: Gerald Larsen, male   DOB: 06-01-1965, 47 y.o.   MRN: [id number] Direct colonoscopy for fobt + stool is ok Thanks ----- Message ----- From: Lucia Gaskins, RN Sent: 10/17/2012 9:27 AM To: Rachael Fee, MD Patient is coming in pre-visit for direct colonoscopy, looks like he was referred by PCP for stool guaiac positive only. Is okay for patient to be direct or office visit 1st?

## 2012-10-25 ENCOUNTER — Encounter: Payer: Self-pay | Admitting: Family Medicine

## 2012-10-27 ENCOUNTER — Ambulatory Visit (AMBULATORY_SURGERY_CENTER): Payer: No Typology Code available for payment source | Admitting: *Deleted

## 2012-10-27 VITALS — Ht 72.0 in | Wt 129.4 lb

## 2012-10-27 DIAGNOSIS — K921 Melena: Secondary | ICD-10-CM

## 2012-10-27 MED ORDER — MOVIPREP 100 G PO SOLR: ORAL | Status: DC

## 2012-10-27 NOTE — Progress Notes (Signed)
No allergies to eggs or soy. No problems with anesthesia.  

## 2012-11-01 ENCOUNTER — Ambulatory Visit (AMBULATORY_SURGERY_CENTER): Payer: No Typology Code available for payment source | Admitting: Gastroenterology

## 2012-11-01 ENCOUNTER — Encounter: Payer: Self-pay | Admitting: Gastroenterology

## 2012-11-01 ENCOUNTER — Other Ambulatory Visit: Payer: No Typology Code available for payment source | Admitting: Gastroenterology

## 2012-11-01 VITALS — BP 123/90 | HR 62 | Temp 97.6°F | Resp 19 | Ht 72.0 in | Wt 229.0 lb

## 2012-11-01 DIAGNOSIS — D126 Benign neoplasm of colon, unspecified: Secondary | ICD-10-CM

## 2012-11-01 DIAGNOSIS — K921 Melena: Secondary | ICD-10-CM

## 2012-11-01 LAB — GLUCOSE, CAPILLARY
Glucose-Capillary: 94 mg/dL (ref 70–99)
Glucose-Capillary: 96 mg/dL (ref 70–99)

## 2012-11-01 MED ORDER — SODIUM CHLORIDE 0.9 % IV SOLN: 500.0000 mL | INTRAVENOUS | Status: DC

## 2012-11-01 NOTE — Patient Instructions (Addendum)
YOU HAD AN ENDOSCOPIC PROCEDURE TODAY AT THE [facility]: Refer to the procedure report that was given to you for any specific questions about what was found during the examination.  If the procedure report does not answer your questions, please call your gastroenterologist to clarify.  If you requested that your care partner not be given the details of your procedure findings, then the procedure report has been included in a sealed envelope for you to review at your convenience later.  YOU SHOULD EXPECT: Some feelings of bloating in the abdomen. Passage of more gas than usual.  Walking can help get rid of the air that was put into your GI tract during the procedure and reduce the bloating. If you had a lower endoscopy (such as a colonoscopy or flexible sigmoidoscopy) you may notice spotting of blood in your stool or on the toilet paper. If you underwent a bowel prep for your procedure, then you may not have a normal bowel movement for a few days.  DIET: Your first meal following the procedure should be a light meal and then it is ok to progress to your normal diet.  A half-sandwich or bowl of soup is an example of a good first meal.  Heavy or fried foods are harder to digest and may make you feel nauseous or bloated.  Likewise meals heavy in dairy and vegetables can cause extra gas to form and this can also increase the bloating.  Drink plenty of fluids but you should avoid alcoholic beverages for 24 hours.  ACTIVITY: Your care partner should take you home directly after the procedure.  You should plan to take it easy, moving slowly for the rest of the day.  You can resume normal activity the day after the procedure however you should NOT DRIVE or use heavy machinery for 24 hours (because of the sedation medicines used during the test).    SYMPTOMS TO REPORT IMMEDIATELY: A gastroenterologist can be reached at any hour.  During normal business hours, 8:30 AM to 5:00 PM Monday through Friday,  call [phone].  After hours and on weekends, please call the GI answering service at [phone] who will take a message and have the physician on call contact you.   Following lower endoscopy (colonoscopy or flexible sigmoidoscopy):  Excessive amounts of blood in the stool  Significant tenderness or worsening of abdominal pains  Swelling of the abdomen that is new, acute  Fever of 100F or higher    FOLLOW UP: If any biopsies were taken you will be contacted by phone or by letter within the next 1-3 weeks.  Call your gastroenterologist if you have not heard about the biopsies in 3 weeks.  Our staff will call the home number listed on your records the next business day following your procedure to check on you and address any questions or concerns that you may have at that time regarding the information given to you following your procedure. This is a courtesy call and so if there is no answer at the home number and we have not heard from you through the emergency physician on call, we will assume that you have returned to your regular daily activities without incident.  SIGNATURES/CONFIDENTIALITY: You and/or your care partner have signed paperwork which will be entered into your electronic medical record.  These signatures attest to the fact that that the information above on your After Visit Summary has been reviewed and is understood.  Full responsibility of the confidentiality   of this discharge information lies with you and/or your care-partner.     

## 2012-11-01 NOTE — Progress Notes (Signed)
The pt tolerated the colonoscopy well. Maw

## 2012-11-01 NOTE — Op Note (Signed)
 [facility] [address] [place], [address]   COLONOSCOPY PROCEDURE REPORT  PATIENT: Gerald Larsen, Gerald Larsen  MR#: [id number] BIRTHDATE: [birth year] , 46  yrs. old GENDER: Male ENDOSCOPIST: Rachael Fee, MD REFERRED NW:[facility] Susann Givens, M.D. PROCEDURE DATE:  11/01/2012 PROCEDURE:   Colonoscopy with snare polypectomy First Screening Colonoscopy - Avg.  risk and is 50 yrs.  old or older - No.  Prior Negative Screening - Now for repeat screening. N/A  History of Adenoma - Now for follow-up colonoscopy & has been > or = to 3 yrs.  N/A  Polyps Removed Today? Yes. ASA CLASS:   Class II INDICATIONS:FOBT positive stool. MEDICATIONS: Fentanyl 75 mcg IV, Versed 7 mg IV, and These medications were titrated to patient response per physician's verbal order  DESCRIPTION OF PROCEDURE:   After the risks benefits and alternatives of the procedure were thoroughly explained, informed consent was obtained.  A digital rectal exam revealed no abnormalities of the rectum.   The LB [id number]  endoscope was introduced through the anus and advanced to the cecum, which was identified by both the appendix and ileocecal valve. No adverse events experienced.   The quality of the prep was good.  The instrument was then slowly withdrawn as the colon was fully examined.  COLON FINDINGS: One small polyp was found, removed and sent to pathology.  This was sessile, 3mm across, located in rectum, removed with cold snare.  The examination was otherwise normal. Retroflexed views revealed no abnormalities. The time to cecum=4 minutes 40 seconds.  Withdrawal time=8 minutes 08 seconds.  The scope was withdrawn and the procedure completed. COMPLICATIONS: There were no complications.  ENDOSCOPIC IMPRESSION: One small polyp was found, removed and sent to pathology. The examination was otherwise normal.  RECOMMENDATIONS: If the polyp(s) removed today are proven to be adenomatous (pre-cancerous)  polyps, you will need a repeat colonoscopy in 5 years.  Otherwise you should continue to follow colorectal cancer screening guidelines for "routine risk" patients with colonoscopy in 10 years.  You will receive a letter within 1-2 weeks with the results of your biopsy as well as final recommendations.  Please call my office if you have not received a letter after 3 weeks.   eSigned:  Rachael Fee, MD 11/01/2012 10:18 AM

## 2012-11-02 ENCOUNTER — Telehealth: Payer: Self-pay

## 2012-11-02 NOTE — Telephone Encounter (Signed)
  Follow up Call-  Call back number 11/01/2012  Post procedure Call Back phone  # [phone]  Permission to leave phone message Yes     Patient questions:  Do you have a fever, pain , or abdominal swelling? no Pain Score  0 *  Have you tolerated food without any problems? yes  Have you been able to return to your normal activities? yes  Do you have any questions about your discharge instructions: Diet   no Medications  no Follow up visit  no  Do you have questions or concerns about your Care? no  Actions: * If pain score is 4 or above: No action needed, pain <4.

## 2012-11-10 ENCOUNTER — Other Ambulatory Visit: Payer: Self-pay | Admitting: Family Medicine

## 2012-11-10 NOTE — Telephone Encounter (Signed)
Is this okay to refill? 

## 2012-11-10 NOTE — Telephone Encounter (Signed)
Medication phoned into [facility].

## 2012-11-10 NOTE — Telephone Encounter (Signed)
OK to renew 

## 2012-11-11 ENCOUNTER — Encounter: Payer: Self-pay | Admitting: Gastroenterology

## 2012-12-08 ENCOUNTER — Encounter: Payer: Self-pay | Admitting: Family Medicine

## 2012-12-08 ENCOUNTER — Ambulatory Visit (INDEPENDENT_AMBULATORY_CARE_PROVIDER_SITE_OTHER): Payer: No Typology Code available for payment source | Admitting: Family Medicine

## 2012-12-08 VITALS — BP 112/60 | HR 96 | Wt 256.0 lb

## 2012-12-08 DIAGNOSIS — F3289 Other specified depressive episodes: Secondary | ICD-10-CM

## 2012-12-08 DIAGNOSIS — F329 Major depressive disorder, single episode, unspecified: Secondary | ICD-10-CM

## 2012-12-08 DIAGNOSIS — F32A Depression, unspecified: Secondary | ICD-10-CM

## 2012-12-08 MED ORDER — CITALOPRAM HYDROBROMIDE 20 MG PO TABS: 20.0000 mg | ORAL_TABLET | Freq: Every day | ORAL | Status: DC

## 2012-12-08 NOTE — Patient Instructions (Signed)
Start taking the medication and called Darryl Hyers [phone]

## 2012-12-08 NOTE — Progress Notes (Signed)
  Subjective:    Patient ID: Gerald Larsen, male    DOB: Sep 30, 1965, 47 y.o.   MRN: [id number]  HPI He complains of a one-month history of mood swings going from being in a good mood to flying off the handle for no particular reason, crying, anxious, feeling overwhelmed, weight gain. No new stressors in his life; his work and home life are going well. He does however have a feeling that he might lose his job because of his age and he is now noting some physical limitations because of this the He has no suicidal or homicidal ideation Review of Systems     Objective:   Physical Exam Alert and in no distress with appropriate affect and slightly tearful.       Assessment & Plan:  Depression - Plan: citalopram (CELEXA) 20 MG tablet  also recommend he get involved in counseling to see if there is an underlying etiology to his depressed and since at this point I see nothing obvious. He is comfortable with this. Recheck here in 2 weeks

## 2012-12-09 ENCOUNTER — Other Ambulatory Visit: Payer: Self-pay | Admitting: Family Medicine

## 2012-12-09 MED ORDER — HYDROCODONE-ACETAMINOPHEN 5-325 MG PO TABS: 1.0000 | ORAL_TABLET | Freq: Three times a day (TID) | ORAL | Status: DC | PRN

## 2012-12-09 NOTE — Telephone Encounter (Signed)
I called out the medication per Dr. Susann Givens. CLS

## 2012-12-09 NOTE — Telephone Encounter (Signed)
Is this okay to refill? 

## 2012-12-09 NOTE — Telephone Encounter (Signed)
Okay to renew

## 2012-12-12 ENCOUNTER — Other Ambulatory Visit: Payer: Self-pay | Admitting: Family Medicine

## 2012-12-12 NOTE — Telephone Encounter (Signed)
Have him set up an appointment for further discussion of need for the codeine

## 2012-12-12 NOTE — Telephone Encounter (Signed)
DR.LALONDE I WANTED TO LET YOU KNOW I CALLED IN THIS MED ON 9/25 # 30 CHANDRA CALLED IN # 30 ON 9/26 NOW HE IS ASKING FOR A REFILL AND I CALLED THE PHARMACY ASKED THEM HE DID GET BOTH RX AND I TOLD THEM TO DENIED THIS ONE. I CALLED AND LEFT PT MESSAGE THAT I COULD NOT OK THIS REFILL BECAUSE HE SHOULD STILL HAVE MED LEFT HOPE THIS WAS OKAY WITH YOU

## 2012-12-22 ENCOUNTER — Ambulatory Visit: Payer: No Typology Code available for payment source | Admitting: Family Medicine

## 2012-12-28 ENCOUNTER — Ambulatory Visit (INDEPENDENT_AMBULATORY_CARE_PROVIDER_SITE_OTHER): Payer: No Typology Code available for payment source | Admitting: Family Medicine

## 2012-12-28 VITALS — BP 130/88 | HR 75 | Wt 264.0 lb

## 2012-12-28 DIAGNOSIS — M25561 Pain in right knee: Secondary | ICD-10-CM

## 2012-12-28 DIAGNOSIS — F329 Major depressive disorder, single episode, unspecified: Secondary | ICD-10-CM

## 2012-12-28 DIAGNOSIS — M25569 Pain in unspecified knee: Secondary | ICD-10-CM

## 2012-12-28 DIAGNOSIS — F3289 Other specified depressive episodes: Secondary | ICD-10-CM

## 2012-12-28 DIAGNOSIS — F32A Depression, unspecified: Secondary | ICD-10-CM

## 2012-12-28 MED ORDER — CITALOPRAM HYDROBROMIDE 20 MG PO TABS: 20.0000 mg | ORAL_TABLET | Freq: Every day | ORAL | Status: DC

## 2012-12-28 NOTE — Progress Notes (Signed)
  Subjective:    Patient ID: Gerald Larsen, male    DOB: May 13, 1965, 47 y.o.   MRN: [id number]  HPI Is here for recheck. He states that he is doing much better. He and his wife had a long discussion concerning the present issue and he thinks it's due to his very busy work schedule and he felt overwhelmed. He is working 2 jobs which keeps him quite busy.  He did seek counseling with cannot work it into his schedule. He also is had difficulty with bilateral knee pain. He does walker tremendous amount of steps every day and by the end of day both knees are hurting. No popping, locking or grinding.   Review of Systems     Objective:   Physical Exam Alert and in no distress. Exam of both knee shows no effusion, laxity, negative anterior drawer and McMurray's.       Assessment & Plan:  Depression - Plan: citalopram (CELEXA) 20 MG tablet  Knee pain, bilateral  continue on Celexa. Discussed options with him and encouraged him to see if he can make time in his schedule or use soma for his comp time for counseling. He is willing to do this to help evaluate all options to make his life and leisure. Also instructed him on terminal extension exercises to help with his knee pain. Discussed the fact that his weight gain his contributing to his knee pain.

## 2013-01-02 ENCOUNTER — Telehealth: Payer: Self-pay | Admitting: Family Medicine

## 2013-01-02 NOTE — Telephone Encounter (Signed)
Pt left message on phone Friday that he needed something for pain for his knees.  That he has an appt Monday, which is today with [facility].  I phoned pt cell # reached voice mail left message to ret call.

## 2013-01-24 ENCOUNTER — Telehealth: Payer: Self-pay | Admitting: Family Medicine

## 2013-01-24 MED ORDER — HYDROCODONE-ACETAMINOPHEN 5-325 MG PO TABS: 1.0000 | ORAL_TABLET | Freq: Three times a day (TID) | ORAL | Status: DC | PRN

## 2013-01-24 NOTE — Telephone Encounter (Signed)
Norco renewed.

## 2013-01-24 NOTE — Telephone Encounter (Signed)
Ph pt reached voice mail left message rx ready to pick up   Norco

## 2013-02-22 ENCOUNTER — Other Ambulatory Visit: Payer: Self-pay | Admitting: Orthopedic Surgery

## 2013-03-14 ENCOUNTER — Other Ambulatory Visit: Payer: Self-pay | Admitting: Orthopedic Surgery

## 2013-03-14 NOTE — H&P (Signed)
Gerald Larsen is an 47 y.o. male.   Chief Complaint: left knee pain HPI: The patient is a 47 year old male who presents with knee complaints. The patient reports left knee and right knee symptoms which began 5 month(s) ago without any known injury.The patient feels that the symptoms are worsening. Past treatment for this problem has included application of ice, application of heat, nonsteroidal anti-inflammatory drugs (Aleve BID) and non-opioid analgesics. Symptoms are reported to be located in the left medial knee and right medial knee and include knee pain, tenderness, swelling (mild to none), stiffness, instability (left knee buckled yesterday) and difficulty ambulating, while symptoms reported today do not include locking. The patient does not report any radiation of symptoms. The patient describes their pain as aching. Onset of symptoms was gradual. Symptoms are exacerbated by weight bearing, kneeling and squatting. Symptoms are not relieved by rest, ice, non-opioid analgesics or opioid analgesics, or steroid injection. Current treatment includes application of heat, application of ice and nonsteroidal anti-inflammatory drugs. Prior knee scope x2 left knee and x1 right knee by Dr Duda for cartilage tears, 20 yrs ago, did well post-operatively and did not have any chronic/ongoing issues. 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 He reports stiffness after sitting, nighttime pain and trouble sleeping. He does occasionally note swelling at the end of the day. Does report popping both knees, right worse than left, which is not new and has been ongoing for some time. His left knee buckled yesterday, otherwise he denies instability.    Past Medical History  Diagnosis Date  . Diabetes mellitus   . Hypertension   . GERD (gastroesophageal  reflux disease)   . Obstructive sleep apnea   . Microalbuminuria   . Hypogonadism male   . Obesity   . Migraines   . Hyperlipidemia     Past Surgical History  Procedure Laterality Date  . Cholecystectomy    . Spine surgery      Family History  Problem Relation Age of Onset  . Arthritis Mother   . Heart disease Mother   . Diabetes Mother   . Depression Mother   . Arthritis Father   . Heart disease Father   . Colon cancer Neg Hx    Social History:  reports that he quit smoking about 24 years ago. He has never used smokeless tobacco. He reports that he does not drink alcohol or use illicit drugs.  Allergies: No Known Allergies   (Not in a hospital admission)  No results found for this or any previous visit (from the past 48 hour(s)). No results found.  Review of Systems  Constitutional: Negative.   HENT: Negative.   Eyes: Negative.   Respiratory: Negative.   Cardiovascular: Negative.   Gastrointestinal: Negative.   Genitourinary: Negative.   Musculoskeletal: Positive for joint pain.  Skin: Negative.   Neurological: Negative.   Psychiatric/Behavioral: Negative.     There were no vitals taken for this visit. Physical Exam  Constitutional: He is oriented to person, place, and time. He appears well-developed and well-nourished.  HENT:  Head: Normocephalic and atraumatic.  Eyes: Conjunctivae and EOM are normal. Pupils are equal, round, and reactive to light.  Neck: Normal range of motion. Neck supple.  Cardiovascular: Normal rate and regular rhythm.   Respiratory: Effort normal and breath sounds normal.  GI: Soft. Bowel sounds are   normal.  Musculoskeletal:  He is tender in the medial joint line. He has a negative anterior drawer and negative pivot shift. Trace effusion. Some fullness posteriorly. Positive McMurray. MRI showed a complex meniscus tear, chronic partial ACL tear, baker's cyst.  Neurological: He is alert and oriented to person, place, and time. He  has normal reflexes.  Skin: Skin is warm and dry.  Psychiatric: He has a normal mood and affect.     Assessment/Plan 1. Symptomatic medial meniscus tear. 2. Partial tear, ACL, baker's cyst, DJD.  We discussed options. He's had surgery 30 years ago. We discussed partial meniscectomy, also exam under anesthesia.  I had a long discussion with the patient concerning the risks and benefits of knee arthroscopy including help from the arthroscopic procedure as well as no help from the arthroscopic procedure or worsening of symptoms. Also discussed infection, DVT, PE, anesthetic complications, etc. Also discussed the possibility of repeat arthroscopic surgery required in the future or total knee replacement. I provided the patient with an illustrated handout and discussed it in detail as well as discussed the postoperative and perioperative courses and return to functional activities including work. Need for postoperative DVT prophylaxis was discussed as well.  Quads strengthening. Avoid pivoting. We discussed future possible ACL reconstruction. He's had no recent injury or degenerative changes. More of a possible chondral flap tear or meniscus tear. He has not had any chronic instability of the knee. Unable to elicit a Lachman. If no help from the injection or the home exercises, activity modification. Also utilize a brace.  Plan left knee arthroscopy, partial medial meniscectomy, exam under anesthesia, manipulation under anesthesia  BISSELL, JACLYN M. for Dr. Beane 03/14/2013, 4:57 PM    

## 2013-03-21 ENCOUNTER — Encounter (HOSPITAL_BASED_OUTPATIENT_CLINIC_OR_DEPARTMENT_OTHER): Payer: Self-pay | Admitting: *Deleted

## 2013-03-23 ENCOUNTER — Encounter (HOSPITAL_BASED_OUTPATIENT_CLINIC_OR_DEPARTMENT_OTHER): Payer: Self-pay | Admitting: *Deleted

## 2013-03-23 NOTE — Progress Notes (Signed)
NPO AFTER MN WITH EXCEPTION CLEAR LIQUIDS UNTIL 0800 (NO CREAM/ MILK PRODUCTS). ARRIVE AT 1215. NEEDS ISTAT AND EKG. WILL TAKE PRILOSEC AM DOS W/ SIPS OF WATER AND IF NEEDED TAKE HYDOCODONE.

## 2013-03-24 ENCOUNTER — Ambulatory Visit (HOSPITAL_BASED_OUTPATIENT_CLINIC_OR_DEPARTMENT_OTHER): Payer: BC Managed Care – PPO | Admitting: Anesthesiology

## 2013-03-24 ENCOUNTER — Encounter (HOSPITAL_BASED_OUTPATIENT_CLINIC_OR_DEPARTMENT_OTHER)
Admission: RE | Disposition: A | Discharge status: Home / Self Care | Payer: Self-pay | Source: Ambulatory Visit | Attending: Specialist

## 2013-03-24 ENCOUNTER — Encounter (HOSPITAL_BASED_OUTPATIENT_CLINIC_OR_DEPARTMENT_OTHER): Payer: Self-pay | Admitting: *Deleted

## 2013-03-24 ENCOUNTER — Ambulatory Visit (HOSPITAL_BASED_OUTPATIENT_CLINIC_OR_DEPARTMENT_OTHER)
Admission: RE | Admit: 2013-03-24 | Discharge: 2013-03-24 | Disposition: A | Discharge status: Home / Self Care | Payer: BC Managed Care – PPO | Source: Ambulatory Visit | Attending: Specialist | Admitting: Specialist

## 2013-03-24 ENCOUNTER — Encounter (HOSPITAL_BASED_OUTPATIENT_CLINIC_OR_DEPARTMENT_OTHER): Payer: BC Managed Care – PPO | Admitting: Anesthesiology

## 2013-03-24 DIAGNOSIS — S83207A Unspecified tear of unspecified meniscus, current injury, left knee, initial encounter: Secondary | ICD-10-CM

## 2013-03-24 DIAGNOSIS — K219 Gastro-esophageal reflux disease without esophagitis: Secondary | ICD-10-CM | POA: Insufficient documentation

## 2013-03-24 DIAGNOSIS — M1710 Unilateral primary osteoarthritis, unspecified knee: Secondary | ICD-10-CM | POA: Insufficient documentation

## 2013-03-24 DIAGNOSIS — G4733 Obstructive sleep apnea (adult) (pediatric): Secondary | ICD-10-CM | POA: Insufficient documentation

## 2013-03-24 DIAGNOSIS — M7120 Synovial cyst of popliteal space [Baker], unspecified knee: Secondary | ICD-10-CM | POA: Insufficient documentation

## 2013-03-24 DIAGNOSIS — S83289A Other tear of lateral meniscus, current injury, unspecified knee, initial encounter: Secondary | ICD-10-CM | POA: Insufficient documentation

## 2013-03-24 DIAGNOSIS — E119 Type 2 diabetes mellitus without complications: Secondary | ICD-10-CM | POA: Insufficient documentation

## 2013-03-24 DIAGNOSIS — IMO0002 Reserved for concepts with insufficient information to code with codable children: Secondary | ICD-10-CM | POA: Insufficient documentation

## 2013-03-24 DIAGNOSIS — I10 Essential (primary) hypertension: Secondary | ICD-10-CM | POA: Insufficient documentation

## 2013-03-24 DIAGNOSIS — X58XXXA Exposure to other specified factors, initial encounter: Secondary | ICD-10-CM | POA: Insufficient documentation

## 2013-03-24 DIAGNOSIS — Z87891 Personal history of nicotine dependence: Secondary | ICD-10-CM | POA: Insufficient documentation

## 2013-03-24 DIAGNOSIS — E785 Hyperlipidemia, unspecified: Secondary | ICD-10-CM | POA: Insufficient documentation

## 2013-03-24 HISTORY — DX: Presence of spectacles and contact lenses: Z97.3

## 2013-03-24 HISTORY — DX: Obstructive sleep apnea (adult) (pediatric): G47.33

## 2013-03-24 HISTORY — PX: KNEE ARTHROSCOPY WITH MEDIAL MENISECTOMY: SHX5651

## 2013-03-24 HISTORY — DX: Type 2 diabetes mellitus without complications: E11.9

## 2013-03-24 HISTORY — DX: Unspecified tear of unspecified meniscus, current injury, left knee, initial encounter: S83.207A

## 2013-03-24 HISTORY — DX: Obstructive sleep apnea (adult) (pediatric): Z99.89

## 2013-03-24 LAB — POCT I-STAT 4, (NA,K, GLUC, HGB,HCT)
Glucose, Bld: 104 mg/dL — ABNORMAL HIGH (ref 70–99)
HCT: 47 % (ref 39.0–52.0)
Hemoglobin: 16 g/dL (ref 13.0–17.0)
Potassium: 4.1 mEq/L (ref 3.7–5.3)
Sodium: 137 mEq/L (ref 137–147)

## 2013-03-24 LAB — GLUCOSE, CAPILLARY: Glucose-Capillary: 98 mg/dL (ref 70–99)

## 2013-03-24 SURGERY — ARTHROSCOPY, KNEE, WITH MEDIAL MENISCECTOMY
Anesthesia: General | Site: Knee | Laterality: Left

## 2013-03-24 MED ORDER — BUPIVACAINE-EPINEPHRINE 0.5% -1:200000 IJ SOLN
INTRAMUSCULAR | Status: DC | PRN
  Administered 2013-03-24: 25 mL

## 2013-03-24 MED ORDER — LACTATED RINGERS IR SOLN
Status: DC | PRN
  Administered 2013-03-24: 6000 mL

## 2013-03-24 MED ORDER — PROMETHAZINE HCL 25 MG/ML IJ SOLN
6.2500 mg | INTRAMUSCULAR | Status: DC | PRN
  Filled 2013-03-24: qty 1

## 2013-03-24 MED ORDER — LACTATED RINGERS IV SOLN
INTRAVENOUS | Status: DC
Start: 2013-03-24 — End: 2013-03-24
  Filled 2013-03-24: qty 1000

## 2013-03-24 MED ORDER — ONDANSETRON HCL 4 MG/2ML IJ SOLN
INTRAMUSCULAR | Status: DC | PRN
  Administered 2013-03-24: 4 mg via INTRAVENOUS

## 2013-03-24 MED ORDER — KETOROLAC TROMETHAMINE 10 MG PO TABS: 10.0000 mg | ORAL_TABLET | Freq: Four times a day (QID) | ORAL | Status: DC | PRN

## 2013-03-24 MED ORDER — PROPOFOL 10 MG/ML IV BOLUS
INTRAVENOUS | Status: DC | PRN
  Administered 2013-03-24: 300 mg via INTRAVENOUS

## 2013-03-24 MED ORDER — FENTANYL CITRATE 0.05 MG/ML IJ SOLN
INTRAMUSCULAR | Status: DC | PRN
  Administered 2013-03-24 (×2): 50 ug via INTRAVENOUS

## 2013-03-24 MED ORDER — ACETAMINOPHEN 10 MG/ML IV SOLN
INTRAVENOUS | Status: DC | PRN
  Administered 2013-03-24: 1000 mg via INTRAVENOUS

## 2013-03-24 MED ORDER — EPINEPHRINE HCL 1 MG/ML IJ SOLN
INTRAMUSCULAR | Status: DC | PRN
  Administered 2013-03-24: 1 mg

## 2013-03-24 MED ORDER — FENTANYL CITRATE 0.05 MG/ML IJ SOLN
25.0000 ug | INTRAMUSCULAR | Status: DC | PRN
  Filled 2013-03-24: qty 1

## 2013-03-24 MED ORDER — HYDROCODONE-ACETAMINOPHEN 7.5-325 MG PO TABS: 1.0000 | ORAL_TABLET | ORAL | Status: DC | PRN

## 2013-03-24 MED ORDER — EPHEDRINE SULFATE 50 MG/ML IJ SOLN
INTRAMUSCULAR | Status: DC | PRN
  Administered 2013-03-24 (×5): 10 mg via INTRAVENOUS

## 2013-03-24 MED ORDER — MIDAZOLAM HCL 2 MG/2ML IJ SOLN
INTRAMUSCULAR | Status: AC
  Filled 2013-03-24: qty 2

## 2013-03-24 MED ORDER — MIDAZOLAM HCL 5 MG/5ML IJ SOLN
INTRAMUSCULAR | Status: DC | PRN
  Administered 2013-03-24: 2 mg via INTRAVENOUS

## 2013-03-24 MED ORDER — FENTANYL CITRATE 0.05 MG/ML IJ SOLN
INTRAMUSCULAR | Status: AC
  Filled 2013-03-24: qty 2

## 2013-03-24 MED ORDER — KETOROLAC TROMETHAMINE 30 MG/ML IJ SOLN
INTRAMUSCULAR | Status: DC | PRN
  Administered 2013-03-24: 30 mg via INTRAVENOUS

## 2013-03-24 MED ORDER — CHLORHEXIDINE GLUCONATE 4 % EX LIQD
60.0000 mL | Freq: Once | CUTANEOUS | Status: DC
  Filled 2013-03-24: qty 60

## 2013-03-24 MED ORDER — LACTATED RINGERS IV SOLN
INTRAVENOUS | Status: DC
  Administered 2013-03-24 (×2): via INTRAVENOUS
  Filled 2013-03-24: qty 1000

## 2013-03-24 MED ORDER — LIDOCAINE HCL (CARDIAC) 20 MG/ML IV SOLN
INTRAVENOUS | Status: DC | PRN
  Administered 2013-03-24: 80 mg via INTRAVENOUS

## 2013-03-24 MED ORDER — CEFAZOLIN SODIUM-DEXTROSE 2-3 GM-% IV SOLR
2.0000 g | INTRAVENOUS | Status: DC
  Filled 2013-03-24: qty 50

## 2013-03-24 SURGICAL SUPPLY — 52 items
BANDAGE ELASTIC 6 VELCRO ST LF (GAUZE/BANDAGES/DRESSINGS) ×2 IMPLANT
BANDAGE GAUZE ELAST BULKY 4 IN (GAUZE/BANDAGES/DRESSINGS) ×2 IMPLANT
BLADE 4.2CUDA (BLADE) IMPLANT
BLADE CUDA SHAVER 3.5 (BLADE) ×2 IMPLANT
BLADE GREAT WHITE 4.2 (BLADE) IMPLANT
BNDG COHESIVE 4X5 TAN NS LF (GAUZE/BANDAGES/DRESSINGS) ×2 IMPLANT
BOOTIES KNEE HIGH SLOAN (MISCELLANEOUS) ×2 IMPLANT
CANISTER SUCT LVC 12 LTR MEDI- (MISCELLANEOUS) ×2 IMPLANT
CANISTER SUCTION 1200CC (MISCELLANEOUS) IMPLANT
CANISTER SUCTION 2500CC (MISCELLANEOUS) ×2 IMPLANT
CANNULA ACUFLEX KIT 5X76 (CANNULA) IMPLANT
CLOTH BEACON ORANGE TIMEOUT ST (SAFETY) IMPLANT
CUTTER MENISCUS  4.2MM (BLADE)
CUTTER MENISCUS 4.2MM (BLADE) IMPLANT
DRAPE ARTHROSCOPY W/POUCH 114 (DRAPES) ×2 IMPLANT
DRSG PAD ABDOMINAL 8X10 ST (GAUZE/BANDAGES/DRESSINGS) ×2 IMPLANT
DURAPREP 26ML APPLICATOR (WOUND CARE) ×2 IMPLANT
ELECT REM PT RETURN 9FT ADLT (ELECTROSURGICAL)
ELECTRODE REM PT RTRN 9FT ADLT (ELECTROSURGICAL) IMPLANT
GLOVE BIOGEL PI IND STRL 7.5 (GLOVE) ×2 IMPLANT
GLOVE BIOGEL PI INDICATOR 7.5 (GLOVE) ×2
GLOVE INDICATOR 7.5 STRL GRN (GLOVE) ×2 IMPLANT
GLOVE SURG SS PI 8.0 STRL IVOR (GLOVE) ×2 IMPLANT
GOWN PREVENTION PLUS LG XLONG (DISPOSABLE) IMPLANT
GOWN STRL REIN XL XLG (GOWN DISPOSABLE) IMPLANT
GOWN STRL REUS W/ TWL LRG LVL3 (GOWN DISPOSABLE) ×1 IMPLANT
GOWN STRL REUS W/ TWL XL LVL3 (GOWN DISPOSABLE) ×1 IMPLANT
GOWN STRL REUS W/TWL LRG LVL3 (GOWN DISPOSABLE) ×1
GOWN STRL REUS W/TWL XL LVL3 (GOWN DISPOSABLE) ×1
IV LACTATED RINGER IRRG 3000ML (IV SOLUTION) ×2
IV LR IRRIG 3000ML ARTHROMATIC (IV SOLUTION) ×2 IMPLANT
IV NS IRRIG 3000ML ARTHROMATIC (IV SOLUTION) IMPLANT
KNEE WRAP E Z 3 GEL PACK (MISCELLANEOUS) ×2 IMPLANT
MINI VAC (SURGICAL WAND) IMPLANT
NDL SAFETY ECLIPSE 18X1.5 (NEEDLE) ×2 IMPLANT
NEEDLE FILTER BLUNT 18X 1/2SAF (NEEDLE) ×1
NEEDLE FILTER BLUNT 18X1 1/2 (NEEDLE) ×1 IMPLANT
NEEDLE HYPO 18GX1.5 SHARP (NEEDLE) ×2
PACK ARTHROSCOPY DSU (CUSTOM PROCEDURE TRAY) ×2 IMPLANT
PACK BASIN DAY SURGERY FS (CUSTOM PROCEDURE TRAY) ×2 IMPLANT
PADDING CAST COTTON 6X4 STRL (CAST SUPPLIES) ×2 IMPLANT
RESECTOR FULL RADIUS 4.2MM (BLADE) IMPLANT
SET ARTHROSCOPY TUBING (MISCELLANEOUS) ×1
SET ARTHROSCOPY TUBING LN (MISCELLANEOUS) ×1 IMPLANT
SPONGE GAUZE 4X4 12PLY (GAUZE/BANDAGES/DRESSINGS) ×2 IMPLANT
SPONGE GAUZE 4X4 12PLY STER LF (GAUZE/BANDAGES/DRESSINGS) ×2 IMPLANT
SUT ETHILON 4 0 PS 2 18 (SUTURE) ×2 IMPLANT
SYR 30ML LL (SYRINGE) ×2 IMPLANT
SYRINGE 10CC LL (SYRINGE) ×2 IMPLANT
TOWEL OR 17X24 6PK STRL BLUE (TOWEL DISPOSABLE) ×2 IMPLANT
WAND 90 DEG TURBOVAC W/CORD (SURGICAL WAND) IMPLANT
WATER STERILE IRR 500ML POUR (IV SOLUTION) ×2 IMPLANT

## 2013-03-24 NOTE — Transfer of Care (Signed)
Immediate Anesthesia Transfer of Care Note  Patient: Gerald Larsen  Procedure(s) Performed: Procedure(s): LEFT KNEE ARTHROSCOPY WITH PARTIAL MEDIAL MENISECTOMY, EUA/MUA (Left)  Patient Location: PACU  Anesthesia Type:General  Level of Consciousness: awake and oriented  Airway & Oxygen Therapy: Patient Spontanous Breathing and Patient connected to nasal cannula oxygen  Post-op Assessment: Report given to PACU RN  Post vital signs: Reviewed and stable  Complications: No apparent anesthesia complications

## 2013-03-24 NOTE — H&P (View-Only) (Signed)
Gerald Larsen is an 48 y.o. male.   Chief Complaint: left knee pain HPI: The patient is a 48 year old male who presents with knee complaints. The patient reports left knee and right knee symptoms which began 5 month(s) ago without any known injury.The patient feels that the symptoms are worsening. Past treatment for this problem has included application of ice, application of heat, nonsteroidal anti-inflammatory drugs (Aleve BID) and non-opioid analgesics. Symptoms are reported to be located in the left medial knee and right medial knee and include knee pain, tenderness, swelling (mild to none), stiffness, instability (left knee buckled yesterday) and difficulty ambulating, while symptoms reported today do not include locking. The patient does not report any radiation of symptoms. The patient describes their pain as aching. Onset of symptoms was gradual. Symptoms are exacerbated by weight bearing, kneeling and squatting. Symptoms are not relieved by rest, ice, non-opioid analgesics or opioid analgesics, or steroid injection. Current treatment includes application of heat, application of ice and nonsteroidal anti-inflammatory drugs. Prior knee scope x2 left knee and x1 right knee by Dr Sharol Given for cartilage tears, 20 yrs ago, did well post-operatively and did not have any chronic/ongoing issues. 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 He reports stiffness after sitting, nighttime pain and trouble sleeping. He does occasionally note swelling at the end of the day. Does report popping both knees, right worse than left, which is not new and has been ongoing for some time. His left knee buckled yesterday, otherwise he denies instability.    Past Medical History  Diagnosis Date  . Diabetes mellitus   . Hypertension   . GERD (gastroesophageal  reflux disease)   . Obstructive sleep apnea   . Microalbuminuria   . Hypogonadism male   . Obesity   . Migraines   . Hyperlipidemia     Past Surgical History  Procedure Laterality Date  . Cholecystectomy    . Spine surgery      Family History  Problem Relation Age of Onset  . Arthritis Mother   . Heart disease Mother   . Diabetes Mother   . Depression Mother   . Arthritis Father   . Heart disease Father   . Colon cancer Neg Hx    Social History:  reports that he quit smoking about 24 years ago. He has never used smokeless tobacco. He reports that he does not drink alcohol or use illicit drugs.  Allergies: No Known Allergies   (Not in a hospital admission)  No results found for this or any previous visit (from the past 48 hour(s)). No results found.  Review of Systems  Constitutional: Negative.   HENT: Negative.   Eyes: Negative.   Respiratory: Negative.   Cardiovascular: Negative.   Gastrointestinal: Negative.   Genitourinary: Negative.   Musculoskeletal: Positive for joint pain.  Skin: Negative.   Neurological: Negative.   Psychiatric/Behavioral: Negative.     There were no vitals taken for this visit. Physical Exam  Constitutional: He is oriented to person, place, and time. He appears well-developed and well-nourished.  HENT:  Head: Normocephalic and atraumatic.  Eyes: Conjunctivae and EOM are normal. Pupils are equal, round, and reactive to light.  Neck: Normal range of motion. Neck supple.  Cardiovascular: Normal rate and regular rhythm.   Respiratory: Effort normal and breath sounds normal.  GI: Soft. Bowel sounds are  normal.  Musculoskeletal:  He is tender in the medial joint line. He has a negative anterior drawer and negative pivot shift. Trace effusion. Some fullness posteriorly. Positive McMurray. MRI showed a complex meniscus tear, chronic partial ACL tear, baker's cyst.  Neurological: He is alert and oriented to person, place, and time. He  has normal reflexes.  Skin: Skin is warm and dry.  Psychiatric: He has a normal mood and affect.     Assessment/Plan 1. Symptomatic medial meniscus tear. 2. Partial tear, ACL, baker's cyst, DJD.  We discussed options. He's had surgery 30 years ago. We discussed partial meniscectomy, also exam under anesthesia.  I had a long discussion with the patient concerning the risks and benefits of knee arthroscopy including help from the arthroscopic procedure as well as no help from the arthroscopic procedure or worsening of symptoms. Also discussed infection, DVT, PE, anesthetic complications, etc. Also discussed the possibility of repeat arthroscopic surgery required in the future or total knee replacement. I provided the patient with an illustrated handout and discussed it in detail as well as discussed the postoperative and perioperative courses and return to functional activities including work. Need for postoperative DVT prophylaxis was discussed as well.  Quads strengthening. Avoid pivoting. We discussed future possible ACL reconstruction. He's had no recent injury or degenerative changes. More of a possible chondral flap tear or meniscus tear. He has not had any chronic instability of the knee. Unable to elicit a Lachman. If no help from the injection or the home exercises, activity modification. Also utilize a brace.  Plan left knee arthroscopy, partial medial meniscectomy, exam under anesthesia, manipulation under anesthesia  Gerald Larsen M. for Dr. Tonita Cong 03/14/2013, 4:57 PM

## 2013-03-24 NOTE — Discharge Instructions (Signed)
ARTHROSCOPIC KNEE SURGERY HOME CARE INSTRUCTIONS ° ° °PAIN °You will be expected to have a moderate amount of pain in the affected knee for approximately two weeks.  However, the first two to four days will be the most severe in terms of the pain you will experience.  Prescriptions have been provided for you to take as needed for the pain.  The pain can be markedly reduced by using the ice/compressive bandage given.  Exchange the ice packs whenever they thaw.  During the night, keep the bandage on because it will still provide some compression for the swelling.  Also, keep the leg elevated on pillows above your heart, and this will help alleviate the pain and swelling. ° °MEDICATION °Prescriptions have been provided to take as needed for pain. To prevent blood clots, take Aspirin 325mg daily with a meal if not on a blood thinner and if no history of stomach ulcers. ° °ACTIVITY °It is preferred that you stay on bedrest for approximately 24 hours.  However, you may go to the bathroom with help.  After this, you can start to be up and about progressively more.  Remember that the swelling may still increase after three to four days if you are up and doing too much.  You may put as much weight on the affected leg as pain will allow.  Use your crutches for comfort and safety.  However, as soon as you are able, you may discard the crutches and go without them.  ° °DRESSING °Keep the current dressing as dry as possible.  Two days after your surgery, you may remove the ice/compressive wrap, and surgical dressing.  You may now take a shower, but do not scrub the sounds directly with soap.  Let water rinse over these and gently wipe with your hand.  Reapply band-aids over the puncture wounds and more gauze if needed.  A slight amount of thin drainage can be normal at this time, and do not let it frighten you.  Reapply the ice/compressive wrap.  You may now repeat this every day each time you shower. ° °SYMPTOMS TO REPORT TO  YOUR DOCTOR ° -Extreme pain. ° -Extreme swelling. ° -Temperature above 101 degrees that does not come down with acetaminophen     (Tylenol). ° -Any changes in the feeling, color or movement of your toes. ° -Extreme redness, heat, swelling or drainage at your incision ° °EXERCISE °It is preferred that you begin to exercise on the day of your surgery.  Straight leg raises and short arc quads should be begun the afternoon or evening of surgery and continued until you come back for your follow-up appointment.   Attached is an instruction sheet on how to perform these two simple exercises.  Do these at least three times per day if not more.  You may bend your knee as much as is comfortable.  The puncture wounds may occasionally be slightly uncomfortable with bending of the knee.  Do not let this frighten you.  It is important to keep your knee motion, but do not overdo it.  If you have significant pain, simply do not bend the knee as far.   You will be given more exercises to perform at your first return visit.   ° °RETURN APPOINTMENT °Please make an appointment to be seen by your doctor in 14 days from your surgery. ° °Patient Signature:  ________________________________________________________ ° °Nurse's Signature:  ________________________________________________________ ° ° °Post Anesthesia Home Care Instructions ° °Activity: °Get plenty of   rest for the remainder of the day. A responsible adult should stay with you for 24 hours following the procedure.  °For the next 24 hours, DO NOT: °-Drive a car °-Operate machinery °-Drink alcoholic beverages °-Take any medication unless instructed by your physician °-Make any legal decisions or sign important papers. ° °Meals: °Start with liquid foods such as gelatin or soup. Progress to regular foods as tolerated. Avoid greasy, spicy, heavy foods. If nausea and/or vomiting occur, drink only clear liquids until the nausea and/or vomiting subsides. Call your physician if vomiting  continues. ° °Special Instructions/Symptoms: °Your throat may feel dry or sore from the anesthesia or the breathing tube placed in your throat during surgery. If this causes discomfort, gargle with warm salt water. The discomfort should disappear within 24 hours. ° °

## 2013-03-24 NOTE — Anesthesia Preprocedure Evaluation (Signed)
Anesthesia Evaluation  Patient identified by MRN, date of birth, ID band Patient awake    Reviewed: Allergy & Precautions, H&P , NPO status , Patient's Chart, lab work & pertinent test results  Airway Mallampati: II TM Distance: >3 FB Neck ROM: Full    Dental no notable dental hx.    Pulmonary sleep apnea , former smoker,  breath sounds clear to auscultation  Pulmonary exam normal       Cardiovascular Exercise Tolerance: Good hypertension, Pt. on medications negative cardio ROS  Rhythm:Regular Rate:Normal     Neuro/Psych  Headaches, negative psych ROS   GI/Hepatic Neg liver ROS, GERD-  Medicated,  Endo/Other  diabetes, Type 2, Oral Hypoglycemic Agents  Renal/GU negative Renal ROS  negative genitourinary   Musculoskeletal negative musculoskeletal ROS (+)   Abdominal (+) + obese,   Peds negative pediatric ROS (+)  Hematology negative hematology ROS (+)   Anesthesia Other Findings   Reproductive/Obstetrics negative OB ROS                           Anesthesia Physical Anesthesia Plan  ASA: II  Anesthesia Plan: General   Post-op Pain Management:    Induction: Intravenous  Airway Management Planned: LMA  Additional Equipment:   Intra-op Plan:   Post-operative Plan: Extubation in OR  Informed Consent: I have reviewed the patients History and Physical, chart, labs and discussed the procedure including the risks, benefits and alternatives for the proposed anesthesia with the patient or authorized representative who has indicated his/her understanding and acceptance.   Dental advisory given  Plan Discussed with: CRNA  Anesthesia Plan Comments:         Anesthesia Quick Evaluation

## 2013-03-24 NOTE — Brief Op Note (Signed)
03/24/2013  3:16 PM  PATIENT:  Gerald Larsen  48 y.o. male  PRE-OPERATIVE DIAGNOSIS:  MEDIAL MENISCUS TEAR LEFT KNEE  POST-OPERATIVE DIAGNOSIS:  MEDIAL MENISCUS TEAR LEFT KNEE  PROCEDURE:  Procedure(s): LEFT KNEE ARTHROSCOPY WITH PARTIAL MEDIAL MENISECTOMY, EUA/MUA (Left)  SURGEON:  Surgeon(s) and Role:    * Johnn Hai, MD - Primary  PHYSICIAN ASSISTANT:   ASSISTANTS: Bissell   ANESTHESIA:   general  EBL:  Total I/O In: 100 [I.V.:100] Out: -   BLOOD ADMINISTERED:none  DRAINS: none   LOCAL MEDICATIONS USED:  MARCAINE     SPECIMEN:  No Specimen  DISPOSITION OF SPECIMEN:  N/A  COUNTS:  YES  TOURNIQUET:  * No tourniquets in log *  DICTATION: .Other Dictation: Dictation Number [id number]  PLAN OF CARE: Discharge to home after PACU  PATIENT DISPOSITION:  PACU - hemodynamically stable.   Delay start of Pharmacological VTE agent (>24hrs) due to surgical blood loss or risk of bleeding: no

## 2013-03-24 NOTE — Interval H&P Note (Signed)
History and Physical Interval Note:  03/24/2013 2:08 PM  Gerald Larsen  has presented today for surgery, with the diagnosis of MEDIAL MENISCUS TEAR LEFT KNEE  The various methods of treatment have been discussed with the patient and family. After consideration of risks, benefits and other options for treatment, the patient has consented to  Procedure(s): LEFT KNEE ARTHROSCOPY WITH PARTIAL MEDIAL MENISECTOMY, EUA/MUA (Left) as a surgical intervention .  The patient's history has been reviewed, patient examined, no change in status, stable for surgery.  I have reviewed the patient's chart and labs.  Questions were answered to the patient's satisfaction.     Shalonda Sachse C

## 2013-03-24 NOTE — Anesthesia Postprocedure Evaluation (Signed)
  Anesthesia Post-op Note  Patient: Gerald Larsen  Procedure(s) Performed: Procedure(s) (LRB): LEFT KNEE ARTHROSCOPY WITH PARTIAL MEDIAL MENISECTOMY, EUA/MUA (Left)  Patient Location: PACU  Anesthesia Type: General  Level of Consciousness: awake and alert   Airway and Oxygen Therapy: Patient Spontanous Breathing  Post-op Pain: mild  Post-op Assessment: Post-op Vital signs reviewed, Patient's Cardiovascular Status Stable, Respiratory Function Stable, Patent Airway and No signs of Nausea or vomiting  Last Vitals:  Filed Vitals:   03/24/13 1715  BP: 110/68  Pulse: 74  Temp: 36.1 C  Resp: 16    Post-op Vital Signs: stable   Complications: No apparent anesthesia complications

## 2013-03-26 NOTE — Op Note (Signed)
NAME:  Gerald Larsen, KOENEN NO.:  [id number]  MEDICAL RECORD NO.:  [id number]  LOCATION:                                 FACILITY:  PHYSICIAN:  Susa Day, M.D.    DATE OF BIRTH:  1965/08/08  DATE OF PROCEDURE:  03/24/2013 DATE OF DISCHARGE:                              OPERATIVE REPORT   PREOPERATIVE DIAGNOSIS:  Medial meniscus tear of the left knee.  POSTOPERATIVE DIAGNOSES:  Medial meniscus tear of the left knee, grade 3 chondromalacia, medial femoral condyle, also lateral meniscus tear.  PROCEDURES PERFORMED:  Left knee arthroscopy, partial medial and lateral meniscectomy, chondroplasty of the medial femoral condyle.  ANESTHESIA:  General.  ASSISTANT:  No assistant.  HISTORY:  A 48 year old with pain, locking, popping and giving way.  MRI indicating medial meniscus tear, indicated for arthroscopic debridement, failing conservative treatment.  Risk and benefits discussed including bleeding, infection, damage to neurovascular suture, DVT, PE, anesthetic complications, etc.  TECHNIQUE:  The patient in supine position, after induction of adequate general anesthesia, 2 g Kefzol, left lower extremity was prepped and draped in the usual sterile fashion.  A lateral parapatellar portal was fashioned with an #11 blade.  Ingress cannula was atraumatically placed. Irrigant was utilized to insufflate the joint.  Under direct visualization, a medial parapatellar portal was fashioned with an #11 blade after localization with an 18-gauge needle sparing the medial meniscus.  Noted was grade 3 changes of medial femoral condyle. Hypertrophic synovitis noted in the medial compartment, performed partial synovectomy, and light chondroplasty performed in the femoral condyle.  The basket introduced, utilizing and performing a partial medial meniscectomy out of the posterior third of the medial meniscus, approximately, 40% of the posterior third was excised.  Further contoured  with a 3.5 Cuda shaver and the remnant was stable to probe palpation.  ACL demonstrated some mild straining.  Negative anterior drawer. Lateral compartment revealed some radial fraying and tearing of the lateral meniscus.  This was shaved to a stable base.  Chondral surfaces were unremarkable.  Suprapatellar pouch revealed some minimal chondral grade 2 changes of the patella.  Normal patellofemoral tracking.  Gutters were unremarkable.  I revisited all compartments and no further pathology amenable to arthroscopic intervention.  I, therefore, removed all instrumentation. Portals were closed with 4-0 nylon simple sutures.  A 0.25% Marcaine with epinephrine was infiltrated in the joint.  Wound was dressed sterilely.  Awoken without difficulty and transported to the recovery room in satisfactory condition.  The patient tolerated the procedure well.  No complications.     Susa Day, M.D.     Geralynn Rile  D:  03/24/2013  T:  03/25/2013  Job:  [id number]

## 2013-03-27 ENCOUNTER — Encounter (HOSPITAL_BASED_OUTPATIENT_CLINIC_OR_DEPARTMENT_OTHER): Payer: Self-pay | Admitting: Specialist

## 2013-05-04 LAB — HM DIABETES EYE EXAM

## 2013-05-11 ENCOUNTER — Encounter: Payer: Self-pay | Admitting: Internal Medicine

## 2013-05-22 ENCOUNTER — Ambulatory Visit
Admission: RE | Admit: 2013-05-22 | Discharge: 2013-05-22 | Disposition: A | Discharge status: Home / Self Care | Payer: BC Managed Care – PPO | Source: Ambulatory Visit | Attending: Medical | Admitting: Medical

## 2013-05-22 ENCOUNTER — Encounter: Payer: Self-pay | Admitting: Medical

## 2013-05-22 ENCOUNTER — Telehealth: Payer: Self-pay | Admitting: Medical

## 2013-05-22 ENCOUNTER — Ambulatory Visit (INDEPENDENT_AMBULATORY_CARE_PROVIDER_SITE_OTHER): Payer: BC Managed Care – PPO | Admitting: Medical

## 2013-05-22 ENCOUNTER — Other Ambulatory Visit: Payer: Self-pay | Admitting: Medical

## 2013-05-22 VITALS — BP 122/88 | HR 72 | Temp 98.1°F | Resp 20 | Wt 270.0 lb

## 2013-05-22 DIAGNOSIS — R509 Fever, unspecified: Secondary | ICD-10-CM

## 2013-05-22 DIAGNOSIS — R059 Cough, unspecified: Secondary | ICD-10-CM

## 2013-05-22 DIAGNOSIS — R042 Hemoptysis: Secondary | ICD-10-CM

## 2013-05-22 DIAGNOSIS — R05 Cough: Secondary | ICD-10-CM

## 2013-05-22 MED ORDER — AMOXICILLIN-POT CLAVULANATE 875-125 MG PO TABS: 1.0000 | ORAL_TABLET | Freq: Two times a day (BID) | ORAL | Status: DC

## 2013-05-22 MED ORDER — HYDROCODONE-HOMATROPINE 5-1.5 MG/5ML PO SYRP
5.0000 mL | ORAL_SOLUTION | Freq: Three times a day (TID) | ORAL | Status: DC | PRN
Start: 2013-05-22 — End: 2013-08-21

## 2013-05-22 NOTE — Telephone Encounter (Signed)
Please call re: chest x ray results   Also needs refill on headache meds  Vicodin    [facility]

## 2013-05-22 NOTE — Progress Notes (Signed)
Subjective:  Gerald Larsen is a 48 y.o. male who presents for 3 weeks of cough/feeling ill.  Symptoms include cough a lot, chest congestion, productive sputum, rattly chest.  Terrible headaches, raw throat, had fever, but not last few days.  Has vomited a few times, but this is attributed to the coughing.  Has no energy. Denies ear pain.  Treatment to date: nyquil.  Wife and mother in law - sick contacts.   He does not smoke.   He does not have a history of bronchitis.   No recent antibiotics.  Doesn't attribute this to allergies.   He has seen some blood in sputum, but thinks its from "raw bleeding throat."  No other aggravating or relieving factors.  No other c/o.  The following portions of the patient's history were reviewed and updated as appropriate: allergies, current medications, past family history, past medical history, past social history, past surgical history and problem list.  ROS as in subjective  Past Medical History  Diagnosis Date  . Hypertension   . Hypogonadism male   . Migraines   . Hyperlipidemia   . GERD (gastroesophageal reflux disease)   . OSA on CPAP   . Type 2 diabetes mellitus   . Acute meniscal tear of left knee   . Wears contact lenses      Objective: BP 122/88  Pulse 72  Temp(Src) 98.1 F (36.7 C) (Oral)  Resp 20  Wt 270 lb (122.471 kg)  General appearance: Alert, WD/WN, no distress,somewhat ill appearing                             Skin: warm, no rash, no diaphoresis                           Head: no sinus tenderness                            Eyes: conjunctiva normal, corneas clear, PERRLA                            Ears: pearly TMs, external ear canals normal                          Nose: septum midline, turbinates swollen, with erythema and clear discharge             Mouth/throat: MMM, tongue normal, mild pharyngeal erythema                           Neck: supple, no adenopathy, no thyromegaly, nontender                          Heart: RRR,  normal S1, S2, no murmurs                         Lungs: +bronchial breath sounds, +scattered rhonchi, no wheezes, no rales                Extremities: no edema, nontender     Assessment: Encounter Diagnoses  Name Primary?  . Cough Yes  . Hemoptysis   . Fever, unspecified       Plan:  Medication orders today include: Hycodan  syrup prn.   Discussed symptoms, concerns, exam findings, and we will send for chest xray.  F/u pending results and recommendations.

## 2013-05-22 NOTE — Telephone Encounter (Signed)
I saw him for sinobronchitis today.  When we called about xray results, he asked for refill on Norco for headaches.  I'm deferring to you since we didn't discuss and he normally sees you for this medication

## 2013-05-23 ENCOUNTER — Telehealth: Payer: Self-pay | Admitting: Family Medicine

## 2013-05-23 MED ORDER — HYDROCODONE-ACETAMINOPHEN 7.5-325 MG PO TABS: 1.0000 | ORAL_TABLET | ORAL | Status: DC | PRN

## 2013-05-23 NOTE — Telephone Encounter (Signed)
Gerald Larsen called regarding his Hydrocodone refill.  I advised it has been refilled and he will pick up written rx.  Pt states throat still sore and I advised him salt water gargles.  He will let us know if no better once he finishes his antibiotic.  He also states rx cough meds too strong for daytime he will try otc cough meds for daytime and let us know if that does not work.

## 2013-07-13 ENCOUNTER — Observation Stay: Payer: Self-pay | Admitting: Internal Medicine

## 2013-07-13 LAB — COMPREHENSIVE METABOLIC PANEL
Albumin: 3.8 g/dL (ref 3.4–5.0)
Alkaline Phosphatase: 71 U/L
Anion Gap: 5 — ABNORMAL LOW (ref 7–16)
BUN: 20 mg/dL — ABNORMAL HIGH (ref 7–18)
Bilirubin,Total: 0.4 mg/dL (ref 0.2–1.0)
Calcium, Total: 9.8 mg/dL (ref 8.5–10.1)
Chloride: 104 mmol/L (ref 98–107)
Co2: 29 mmol/L (ref 21–32)
Creatinine: 0.96 mg/dL (ref 0.60–1.30)
EGFR (African American): >60
EGFR (Non-African Amer.): >60
Glucose: 121 mg/dL — ABNORMAL HIGH (ref 65–99)
Osmolality: 280 (ref 275–301)
Potassium: 3.9 mmol/L (ref 3.5–5.1)
SGOT(AST): 45 U/L — ABNORMAL HIGH (ref 15–37)
SGPT (ALT): 60 U/L (ref 12–78)
Sodium: 138 mmol/L (ref 136–145)
Total Protein: 7.2 g/dL (ref 6.4–8.2)

## 2013-07-13 LAB — TROPONIN I
Troponin-I: <0.02 ng/mL
Troponin-I: <0.02 ng/mL
Troponin-I: <0.02 ng/mL

## 2013-07-13 LAB — CBC
HCT: 44.9 % (ref 40.0–52.0)
HGB: 14.8 g/dL (ref 13.0–18.0)
MCH: 29.2 pg (ref 26.0–34.0)
MCHC: 33 g/dL (ref 32.0–36.0)
MCV: 88 fL (ref 80–100)
Platelet: 197 10*3/uL (ref 150–440)
RBC: 5.08 10*6/uL (ref 4.40–5.90)
RDW: 14.8 % — ABNORMAL HIGH (ref 11.5–14.5)
WBC: 5.7 10*3/uL (ref 3.8–10.6)

## 2013-07-13 LAB — CK-MB
CK-MB: 4.4 ng/mL — ABNORMAL HIGH (ref 0.5–3.6)
CK-MB: 3.5 ng/mL (ref 0.5–3.6)

## 2013-07-13 LAB — APTT: Activated PTT: 30.6 secs (ref 23.6–35.9)

## 2013-07-14 LAB — LIPID PANEL
Cholesterol: 179 mg/dL (ref 0–200)
HDL Cholesterol: 57 mg/dL (ref 40–60)
Ldl Cholesterol, Calc: 93 mg/dL (ref 0–100)
Triglycerides: 143 mg/dL (ref 0–200)
VLDL Cholesterol, Calc: 29 mg/dL (ref 5–40)

## 2013-07-14 LAB — BASIC METABOLIC PANEL
Anion Gap: 3 — ABNORMAL LOW (ref 7–16)
BUN: 14 mg/dL (ref 7–18)
Calcium, Total: 8.9 mg/dL (ref 8.5–10.1)
Chloride: 109 mmol/L — ABNORMAL HIGH (ref 98–107)
Co2: 28 mmol/L (ref 21–32)
Creatinine: 0.9 mg/dL (ref 0.60–1.30)
EGFR (African American): >60
EGFR (Non-African Amer.): >60
Glucose: 124 mg/dL — ABNORMAL HIGH (ref 65–99)
Osmolality: 281 (ref 275–301)
Potassium: 4 mmol/L (ref 3.5–5.1)
Sodium: 140 mmol/L (ref 136–145)

## 2013-07-14 LAB — PROTIME-INR
INR: 1
Prothrombin Time: 13 secs (ref 11.5–14.7)

## 2013-07-14 LAB — TSH: Thyroid Stimulating Horm: 1.12 u[IU]/mL

## 2013-07-20 ENCOUNTER — Telehealth: Payer: Self-pay | Admitting: Family Medicine

## 2013-07-20 ENCOUNTER — Encounter: Payer: Self-pay | Admitting: Family Medicine

## 2013-07-20 ENCOUNTER — Ambulatory Visit (INDEPENDENT_AMBULATORY_CARE_PROVIDER_SITE_OTHER): Payer: BC Managed Care – PPO | Admitting: Family Medicine

## 2013-07-20 VITALS — BP 112/64 | HR 68 | Wt 274.0 lb

## 2013-07-20 DIAGNOSIS — G43909 Migraine, unspecified, not intractable, without status migrainosus: Secondary | ICD-10-CM

## 2013-07-20 DIAGNOSIS — IMO0002 Reserved for concepts with insufficient information to code with codable children: Secondary | ICD-10-CM

## 2013-07-20 MED ORDER — HYDROCODONE-ACETAMINOPHEN 7.5-325 MG PO TABS: 1.0000 | ORAL_TABLET | ORAL | Status: DC | PRN

## 2013-07-20 NOTE — Progress Notes (Signed)
   Subjective:    Patient ID: Gerald Larsen, male    DOB: 1965/10/23, 48 y.o.   MRN: [id number]  HPI He was recently seen and evaluated for chest pain. He did have a catheterization done and subsequent to that developed a hematoma in the right inguinal area. He is here for further evaluation. He also would like a refill on his Norco. He does get migraines and tripped hands have not been useful. He uses the Norco very sparingly.   Review of Systems     Objective:   Physical Exam Page Spiro and in no distress. Ecchymotic areas noted in the right inguinal area. Difficult to feel a femoral pulse however distal pulses were appreciated with good capillary refill. Skin is warm and dry.       Assessment & Plan:  Migraine  Hematoma complicating a procedure  I reassured him that the hematoma will slowly go away. I see no evidence of any occlusive problem. I will also give him Norco.

## 2013-07-24 NOTE — Telephone Encounter (Signed)
Done

## 2013-07-25 ENCOUNTER — Inpatient Hospital Stay: Payer: BC Managed Care – PPO | Admitting: Family Medicine

## 2013-08-02 ENCOUNTER — Other Ambulatory Visit: Payer: Self-pay | Admitting: Family Medicine

## 2013-08-17 ENCOUNTER — Telehealth: Payer: Self-pay | Admitting: Family Medicine

## 2013-08-17 DIAGNOSIS — G43909 Migraine, unspecified, not intractable, without status migrainosus: Secondary | ICD-10-CM

## 2013-08-17 MED ORDER — HYDROCODONE-ACETAMINOPHEN 7.5-325 MG PO TABS: 1.0000 | ORAL_TABLET | ORAL | Status: DC | PRN

## 2013-08-17 NOTE — Telephone Encounter (Signed)
Pt.notified

## 2013-08-17 NOTE — Telephone Encounter (Signed)
Have him schedule a followup appointment concerning his headaches and let he know to pick up his prescription

## 2013-08-17 NOTE — Telephone Encounter (Signed)
Needs refill on Norco  Please call when ready [phone]

## 2013-08-21 ENCOUNTER — Encounter: Payer: Self-pay | Admitting: Family Medicine

## 2013-08-21 ENCOUNTER — Ambulatory Visit (INDEPENDENT_AMBULATORY_CARE_PROVIDER_SITE_OTHER): Payer: BC Managed Care – PPO | Admitting: Family Medicine

## 2013-08-21 VITALS — BP 110/80 | HR 60 | Wt 277.0 lb

## 2013-08-21 DIAGNOSIS — G43909 Migraine, unspecified, not intractable, without status migrainosus: Secondary | ICD-10-CM

## 2013-08-21 DIAGNOSIS — G44209 Tension-type headache, unspecified, not intractable: Secondary | ICD-10-CM

## 2013-08-21 MED ORDER — SUMATRIPTAN SUCCINATE 100 MG PO TABS: 100.0000 mg | ORAL_TABLET | ORAL | Status: DC | PRN

## 2013-08-21 NOTE — Progress Notes (Signed)
   Subjective:    Patient ID: Gerald Larsen, male    DOB: 06/05/1965, 48 y.o.   MRN: [id number]  HPI  he is here for consult concerning headaches. He does have a history of migraine headaches and has had evaluation in the past. He had difficulty with previous medications and usually Vicodin works best. He was also given another medicine but he is unsure the name of it also tend to work well. He has 3 or 4 migraine headaches per month. He does get photophobia, orbital throbbing pain and vomiting. He also has been under a lot of stress and has been getting tension headaches. His, he was bought by another company and he essentially has to start over with a 90 day grace period. Also his daughter recently got married. His marriage is now behind him he does feel the stress has been reduced.   Review of Systems     Objective:   Physical Exam Alert and in no distress otherwise not examined       Assessment & Plan:  Migraine - Plan: SUMAtriptan (IMITREX) 100 MG tablet  Tension headache  he will let me know the name of the medicine attended the work well. He does not remember being tried on Imitrex and I will therefore try him on that. Recommend heat and stretching for his tension headache as well as anti-inflammatory of choice. Recommended trying to avoid the codeine for that. Also discussed muscle relaxants and apparently he had unacceptable side effects from them.

## 2013-08-21 NOTE — Patient Instructions (Signed)
Try the Imitrex for the migraine and do it early in the process and if that doesn't work been using codeine. For the tension headaches May she use heat and stretching and use Advil  initially

## 2013-08-24 ENCOUNTER — Other Ambulatory Visit: Payer: Self-pay | Admitting: Family Medicine

## 2013-08-31 ENCOUNTER — Other Ambulatory Visit: Payer: Self-pay | Admitting: Family Medicine

## 2013-09-27 ENCOUNTER — Other Ambulatory Visit: Payer: Self-pay | Admitting: Family Medicine

## 2013-10-12 ENCOUNTER — Other Ambulatory Visit: Payer: Self-pay | Admitting: Family Medicine

## 2013-10-12 ENCOUNTER — Encounter: Payer: Self-pay | Admitting: Family Medicine

## 2013-10-12 ENCOUNTER — Ambulatory Visit (INDEPENDENT_AMBULATORY_CARE_PROVIDER_SITE_OTHER): Payer: BC Managed Care – PPO | Admitting: Family Medicine

## 2013-10-12 VITALS — BP 124/80 | HR 80 | Wt 260.0 lb

## 2013-10-12 DIAGNOSIS — R7309 Other abnormal glucose: Secondary | ICD-10-CM

## 2013-10-12 DIAGNOSIS — R7302 Impaired glucose tolerance (oral): Secondary | ICD-10-CM

## 2013-10-12 DIAGNOSIS — G4730 Sleep apnea, unspecified: Secondary | ICD-10-CM

## 2013-10-12 DIAGNOSIS — I10 Essential (primary) hypertension: Secondary | ICD-10-CM

## 2013-10-12 DIAGNOSIS — R0602 Shortness of breath: Secondary | ICD-10-CM

## 2013-10-12 DIAGNOSIS — R5381 Other malaise: Secondary | ICD-10-CM

## 2013-10-12 DIAGNOSIS — R5383 Other fatigue: Secondary | ICD-10-CM

## 2013-10-12 LAB — CBC WITH DIFFERENTIAL/PLATELET
Basophils Absolute: 0.1 10*3/uL (ref 0.0–0.1)
Basophils Relative: 1 % (ref 0–1)
Eosinophils Absolute: 0.2 10*3/uL (ref 0.0–0.7)
Eosinophils Relative: 2 % (ref 0–5)
HCT: 46.7 % (ref 39.0–52.0)
Hemoglobin: 16.4 g/dL (ref 13.0–17.0)
Lymphocytes Relative: 27 % (ref 12–46)
Lymphs Abs: 2.3 10*3/uL (ref 0.7–4.0)
MCH: 30.1 pg (ref 26.0–34.0)
MCHC: 35.1 g/dL (ref 30.0–36.0)
MCV: 85.7 fL (ref 78.0–100.0)
Monocytes Absolute: 0.5 10*3/uL (ref 0.1–1.0)
Monocytes Relative: 6 % (ref 3–12)
Neutro Abs: 5.5 10*3/uL (ref 1.7–7.7)
Neutrophils Relative %: 64 % (ref 43–77)
Platelets: 320 10*3/uL (ref 150–400)
RBC: 5.45 MIL/uL (ref 4.22–5.81)
RDW: 14.8 % (ref 11.5–15.5)
WBC: 8.6 10*3/uL (ref 4.0–10.5)

## 2013-10-12 LAB — COMPREHENSIVE METABOLIC PANEL
ALT: 58 U/L — ABNORMAL HIGH (ref 0–53)
AST: 46 U/L — ABNORMAL HIGH (ref 0–37)
Albumin: 4.3 g/dL (ref 3.5–5.2)
Alkaline Phosphatase: 59 U/L (ref 39–117)
BUN: 15 mg/dL (ref 6–23)
CO2: 23 mEq/L (ref 19–32)
Calcium: 9.7 mg/dL (ref 8.4–10.5)
Chloride: 100 mEq/L (ref 96–112)
Creat: 1.02 mg/dL (ref 0.50–1.35)
Glucose, Bld: 116 mg/dL — ABNORMAL HIGH (ref 70–99)
Potassium: 3.8 mEq/L (ref 3.5–5.3)
Sodium: 137 mEq/L (ref 135–145)
Total Bilirubin: 0.5 mg/dL (ref 0.2–1.2)
Total Protein: 7.2 g/dL (ref 6.0–8.3)

## 2013-10-12 LAB — POCT GLYCOSYLATED HEMOGLOBIN (HGB A1C): Hemoglobin A1C: 5.6

## 2013-10-12 NOTE — Progress Notes (Signed)
   Subjective:    Patient ID: Gerald Larsen, male    DOB: 10-21-1965, 49 y.o.   MRN: [id number]  HPI He has a 2 month history of difficulty with fatigue, short of breath, anorexia, no stamina area he has noted fever and chills in the last couple of days as well as worsening of his other symptoms. His meds haven't changed, he does not smoke or drink. He has no PND. He does state that he has more energy in the morning than in the afternoon. He's not noticed any change in his bowel habits. He does have sleep apnea and apparently is using a dental appliance for this. He is able to work but states he barely gets through the day. He does have a history of glucose intolerance.  Review of Systems     Objective:   Physical Exam alert and in no distress. Tympanic membranes and canals are normal. Throat is clear. Tonsils are normal. Neck is supple without adenopathy or thyromegaly. Cardiac exam shows a regular sinus rhythm without murmurs or gallops. Lungs are clear to auscultation. EKG does show some ST T changes in the lateral leads.       Assessment & Plan:  Other fatigue - Plan: CBC with Differential, Comprehensive metabolic panel, EKG 83-MOQH, DG Chest 2 View  Shortness of breath - Plan: CBC with Differential, Comprehensive metabolic panel, EKG 47-MLYY, DG Chest 2 View, Brain natriuretic peptide  Glucose intolerance (impaired glucose tolerance) - Plan: POCT glycosylated hemoglobin (Hb A1C)  Essential hypertension - Plan: CBC with Differential, Comprehensive metabolic panel, EKG 50-PTWS  Sleep apnea  I will await the blood work before further evaluation however he will most likely need cardiology referral

## 2013-10-13 LAB — BRAIN NATRIURETIC PEPTIDE: Brain Natriuretic Peptide: 2.6 pg/mL (ref 0.0–100.0)

## 2013-10-13 LAB — TESTOSTERONE: Testosterone: 349 ng/dL (ref 300–890)

## 2013-10-16 ENCOUNTER — Telehealth: Payer: Self-pay | Admitting: Family Medicine

## 2013-10-16 LAB — HEPATITIS PANEL, ACUTE
HCV Ab: NEGATIVE
Hep A IgM: NONREACTIVE
Hep B C IgM: NONREACTIVE
Hepatitis B Surface Ag: NEGATIVE

## 2013-10-16 NOTE — Telephone Encounter (Signed)
Wife [facility]) called and left message wanting test results.  Mike's new phone number is [phone].  Dawns phone [phone]

## 2013-10-19 ENCOUNTER — Ambulatory Visit: Payer: BC Managed Care – PPO | Admitting: Family Medicine

## 2013-10-20 ENCOUNTER — Encounter: Payer: Self-pay | Admitting: Family Medicine

## 2013-10-20 ENCOUNTER — Ambulatory Visit (INDEPENDENT_AMBULATORY_CARE_PROVIDER_SITE_OTHER): Payer: BC Managed Care – PPO | Admitting: Family Medicine

## 2013-10-20 VITALS — BP 120/80 | HR 73 | Wt 266.0 lb

## 2013-10-20 DIAGNOSIS — R5383 Other fatigue: Secondary | ICD-10-CM

## 2013-10-20 DIAGNOSIS — R5381 Other malaise: Secondary | ICD-10-CM

## 2013-10-20 DIAGNOSIS — G4730 Sleep apnea, unspecified: Secondary | ICD-10-CM

## 2013-10-20 NOTE — Progress Notes (Signed)
   Subjective:    Patient ID: Gerald Larsen, male    DOB: 1965-08-24, 48 y.o.   MRN: [id number]  HPI He is here for recheck. He states that he is feeling better. He does have a history of sleep apnea however has switched to a dental appliance. He has not gotten testing since he used a dental appliance.   Review of Systems     Objective:   Physical Exam Alert and in no distress otherwise not examined       Assessment & Plan:  Sleep apnea  Other fatigue  after discussion with him, I will write for some new CPAP supplies. He will use the machine for one month then and get a read out. Discussed the possibility of using the dental appliance but stated that I would need to do a study while she's on the appliance make sure it is truly working. We will hold off on that.

## 2013-10-25 ENCOUNTER — Telehealth: Payer: Self-pay | Admitting: Family Medicine

## 2013-10-25 NOTE — Telephone Encounter (Signed)
Dawn called and states pt is still not well, staying dizzy.  She wants to go ahead and have him referred to cardiologist Dr Gwenlyn Found.   Also wants him started back on testosterone, preferably something different from last time.  Also thinks his CPAP needs to be monitored again.  Please advise Dawn [phone].

## 2013-10-26 ENCOUNTER — Ambulatory Visit: Payer: Self-pay

## 2013-10-26 ENCOUNTER — Other Ambulatory Visit: Payer: Self-pay | Admitting: Occupational Medicine

## 2013-10-26 DIAGNOSIS — R52 Pain, unspecified: Secondary | ICD-10-CM

## 2013-10-31 ENCOUNTER — Ambulatory Visit: Payer: BC Managed Care – PPO | Admitting: Family Medicine

## 2014-01-10 ENCOUNTER — Ambulatory Visit (INDEPENDENT_AMBULATORY_CARE_PROVIDER_SITE_OTHER): Payer: BC Managed Care – PPO | Admitting: Medical

## 2014-01-10 ENCOUNTER — Encounter: Payer: Self-pay | Admitting: Medical

## 2014-01-10 VITALS — BP 140/88 | HR 72 | Temp 98.2°F | Resp 16 | Ht 72.0 in | Wt 252.0 lb

## 2014-01-10 DIAGNOSIS — J029 Acute pharyngitis, unspecified: Secondary | ICD-10-CM

## 2014-01-10 DIAGNOSIS — F410 Panic disorder [episodic paroxysmal anxiety] without agoraphobia: Secondary | ICD-10-CM

## 2014-01-10 LAB — POCT RAPID STREP A (OFFICE): Rapid Strep A Screen: NEGATIVE

## 2014-01-10 MED ORDER — AMOXICILLIN 500 MG PO CAPS: 500.0000 mg | ORAL_CAPSULE | Freq: Three times a day (TID) | ORAL | Status: DC

## 2014-01-10 MED ORDER — DIAZEPAM 10 MG PO TABS: ORAL_TABLET | ORAL | Status: DC

## 2014-01-10 NOTE — Progress Notes (Signed)
Subjective:  KOA ZOELLER is a 48 y.o. male who presents for evaluation of sore throat that started yesterday. Accompanied by wife.  He has not had a recent close exposure to someone with proven streptococcal pharyngitis.  Associated symptoms include chills, hot feeling at times.  Denies cough, ear pain, sinus pressure, no nasal congestion or drainage, no tender nodes, no wheezing, no fever, no NVD.  He does note hx/o strep infections.  Treatment to date: ibuprofen, several sore throat sprays..  No sick contacts.  No other aggravating or relieving factors.  No other c/o.  The following portions of the patient's history were reviewed and updated as appropriate: allergies, current medications, past medical history, past social history, past surgical history and problem list.  ROS as in subjective   Objective: Filed Vitals:   01/10/14 0814  BP: 140/88  Pulse: 72  Temp: 98.2 F (36.8 C)    General appearance: no distress, WD/WN, ill-appearing, diaphoretic, clammy, cool HEENT: normocephalic, conjunctiva/corneas normal, sclerae anicteric, nares patent, no discharge or erythema, pharynx with erythema, no obvious exudate.  Oral cavity: MMM, no lesions  Neck: supple, tender anterior shoddy nodes, no thyromegaly Heart: RRR, normal S1, S2, no murmurs Lungs: CTA bilaterally, no wheezes, rhonchi, or rales  Laboratory Strep test done. Results:negative.    Assessment and Plan: Encounter Diagnoses  Name Primary?  . Sore throat Yes  . Panic attack    Student initially saw patient, but after we both re-entered room, he was clammy cool, diaphoretic and seemed distressed.  He notes that any time he gets sick, he freaks out, gets panic feeling all over, and gets like this.   Had similar panic attack during May hospitalization for chest pain, ultimately had cardiac cath, no stents placed.   But was given Valium at that hospitalization to calm his nerves.   In general he denies anxiety, but only gets  panicky with thinks concerning his health.    Also, when we went back in the room, he notes a gush of mucous drained and popped in the back of his throat, he coughed out some of this which was a little bloody.     On re-exam, no obvious abscess no obvious swelling in the back of the throat, airway is open, breathing is normal.  Repeat is exam unremarkable.  Thus, he may have had a abscess that rupture spontaneously.     Gave amoxicillin for suspected spontaneously draining tonsillar abscess, can c/t OTC analgesics, salt water gargles.  Can use short term Valium for his panic feeling.  Reassured, discussed exam findings . If worse, if fever over 101, if oral/facial swelling/wheezing, SOB, then call,return or go to the ED.  He and wife agree with plan, stable to leave at this time.    Call/return prn.  Patient was seen in conjunction with PA student Veatrice Bourbon, and I have also evaluated and examined patient, agree with student's notes, student supervised by me.

## 2014-01-25 ENCOUNTER — Telehealth: Payer: Self-pay | Admitting: Family Medicine

## 2014-01-25 MED ORDER — HYDROCODONE-ACETAMINOPHEN 5-325 MG PO TABS: 1.0000 | ORAL_TABLET | Freq: Four times a day (QID) | ORAL | Status: DC | PRN

## 2014-01-25 NOTE — Telephone Encounter (Signed)
Called pt and advised refill ready to pick up

## 2014-01-25 NOTE — Telephone Encounter (Signed)
Pt has had migraine for 2 days & Imitrex not helping this time.  Wants refill Norco that he has taken in the past for headaches

## 2014-02-22 ENCOUNTER — Ambulatory Visit: Payer: Self-pay | Admitting: Orthopedic Surgery

## 2014-02-28 ENCOUNTER — Ambulatory Visit: Payer: Self-pay | Admitting: Orthopedic Surgery

## 2014-03-03 ENCOUNTER — Other Ambulatory Visit: Payer: Self-pay | Admitting: Orthopedic Surgery

## 2014-03-03 NOTE — H&P (Signed)
[name] DOB: [birth year] Married / Language: [name] / Race: White Male  Chief complaint: L shoulder pain  History of Present Illness The patient is a 48 year old male who presents today for follow up of their shoulder. The patient is being followed for their pain. They are 11 month(s) out from lifting injury. Symptoms reported today include: pain. and report their pain level to be moderate to severe. Current treatment includes: activity modification and NSAIDs (Advil). Note for "Shoulder complaints": [name] follows up for his left shoulder. He was last seen in April for his knee, which is doing well. He last had a steroid injection in the shoulder in February and reports ongoing pain. He unfortunately had a foot fracture, but that did force him to also rest the shoulder. Despite rest, NSAIDs, activity modifications, HEP, he reports continued pain in the left shoulder, into the left upper arm, decreased motion and strength. He denies any new injury. We had previously discussed surgery for his shoulder and he would like to proceed with that at this point.  Allergies No Known Drug Allergies10/20/2014  Family History  Congestive Heart Failure First Degree Relatives. mother and father Diabetes Mellitus mother First Degree Relatives Hypertension First Degree Relatives. mother  Social History Tobacco use Former smoker. former smoker; smoke(d) 1 pack(s) per day Tobacco / smoke exposure no Exercise Exercises daily; does running / walking Current work status working full time Living situation live with spouse Children 1 Pain Contract no Drug/Alcohol Rehab (Previously) no Illicit drug use no Marital status married Alcohol use current drinker; drinks wine; only occasionally per week  Medication History  MetFORMIN HCl (Oral) Specific dose unknown - Active. Hydrochlorothiazide (Oral) Specific dose unknown - Active. Medications Reconciled  Past Surgical History   Spinal Surgery Foot Surgery left Gallbladder Surgery open Knee arthroscopy  Past Medical Hx Diabetes Mellitus, Type I Migraine Headache  Review of Systems  General Not Present- Fever and Weight Loss. Skin Not Present- Rash. Cardiovascular Not Present- Chest Pain and Shortness of Breath. Male Genitourinary Not Present- Painful Urination. Musculoskeletal Present- Joint Pain, Joint Stiffness, Muscle Pain and Muscle Weakness. Not Present- Joint Swelling. Neurological Not Present- Difficulty with balance, Loss of bladder control, Loss of bowel control, Numbness and Weakness. Endocrine Not Present- Excessive Thirst and Excessive Urination. Hematology Not Present- Easy Bruising.  Physical Exam General Mental Status -Alert. General Appearance-pleasant, Not in acute distress. Orientation-Oriented X3. Build & Nutrition-Overweight. Posture-Normal posture.  Musculoskeletal Left Shoulder: Inspection and Palpation - Clinical Contours - normal clinical contours present. Tenderness - subacromial space tender to palpation, no tenderness to palpation of the [facility] joint, no tenderness to palpation of the [place] joint, no tenderness to palpation of the clavicle, no tenderness to palpation of the anterior joint line. Swelling - none. Effusion - none. Tissue tension/texture is - soft. Sensation - intact to light touch. Skin - Color - no ecchymosis, no erythema. ROM: Internal Rotation - AROM - severely decreased and painful. External Rotation - AROM - moderately decreased and painful. Flexion - AROM - moderately decreased(approx 90 degrees, isolated) and painful. Glenohumeral Abduction - AROM - moderately decreased(approx 60 degrees when isolated) and painful. ROM - Testing limited - due to pain. Strength and Tone - Testing limited - due to pain. Abduction - 4+/5 and painful. Internal Rotation - 4+/5 and painful. External Rotation - 4+/5 and painful. Left Shoulder - Instability - sulcus sign  negative. Impingement - impingement sign positive and secondary impingement sign positive. Deformities/Malalignments/Discrepancies - no deformities noted. Special  Testing - Speed's test negative.  Imaging MRI L shoulder 03/15/13 images and report reviewed today by Dr. Tonita Cong. There is a subscapularis tear with medial dislocation of the long head bicep tendon. There is also a partial tear of the supraspinatus less than 50% thickness, interstitial and insertional. Severe AC DJD.  Repeat xrays ordered, obtained, reviewed today by Dr. Tonita Cong with no fx, subluxation, dislocation, lytic or blastic lesions. There is progressive AC arthrosis with undersurface spurring.  Assessment & Plan Traumatic rotator cuff tear, left Acromioclavicular arthrosis, left  Impingement syndrome of left shoulder  Pt with ongoing L shoulder pain, weakness, stiffness refractory to steroid injections, pain medications, NSAIDs, activity modifications, relative rest, HEP, 11 months s/p lifting injury. We discussed relevant anatomy, reviewed his studies, discussed tx options. He has had limited success with injections in the past. We previously discussed surgery but he had a foot injury in the interim. He would now like to schedule surgery which is reasonable given 11 months duration with ongoing symptoms. At this point, recommend proceeding with left shoulder arthroscopy, SAD, EUA, MUA, DCR, possible mini-open RCR. Discussed the procedure itself as well as risks, complications and alternatives, including but not limited to DVT, PE, infx, bleeding, failure of procedure, need for secondary procedure, ongoing pain/symptoms, anesthesia risk, even stroke or death. Also discussed typical post-op protocols, time out of work, ice and elevation, PT phases, HEP, use of the sling. Discussed importance of activity modifications long term to avoid reinjury. Patient desires to proceed with surgery. He will continue with NSAIDs for pain in the interim,  did tolerate Percocet well in the past and will plan for that post-operatively. We will proceed accordingly and he will follow up 10-14 days post-op for suture removal and will call with any questions or concerns in the interim.  I had a long discussion with the patient concerning the risks and benefits of a rotator cuff repair, including bleeding, infection, prolonged postoperative recovery, which may require 3 to 5 months until maximum medical improvement. Overight procedure with initiation of early passive range of motion within physical therapy. Avoid any active motion for the first six weeks. This is all in an effort to avoid recurrent tear of the rotator cuff and adhesive capsulitis. Return to work without use of the arm can be obtained following two weeks. However, driving will be a challenge. We also discussed the possibility of requiring implants including bone anchors,as well as an Allograft patch graft if a massive rotator cuff tear is encountered. Removal of any bones for spurs as well as bursitis will be performed during the procedure and also any associated anesthetic complications as well.   Plan left shoulder arthroscopy, SAD, EUA, MUA, DCR, possible mini-open RCR  Signed electronically by Lacie Draft PA-C for Dr. Tonita Cong

## 2014-03-05 ENCOUNTER — Other Ambulatory Visit: Payer: Self-pay | Admitting: Family Medicine

## 2014-03-05 ENCOUNTER — Ambulatory Visit: Payer: Self-pay | Admitting: Orthopedic Surgery

## 2014-03-05 NOTE — H&P (Signed)
Gerald Larsen DOB: 08-23-1965 Married / Language: Gerald Larsen / Race: White Male  Chief complaint: L shoulder pain  History of Present Illness The patient is a 48 year old male who presents today for follow up of their shoulder. The patient is being followed for their pain. They are 11 month(s) out from lifting injury. Symptoms reported today include: pain. and report their pain level to be moderate to severe. Current treatment includes: activity modification and NSAIDs (Advil). Note for "Shoulder complaints": Gerald Larsen follows up for his left shoulder. He was last seen in April for his knee, which is doing well. He last had a steroid injection in the shoulder in February and reports ongoing pain. He unfortunately had a foot fracture, but that did force him to also rest the shoulder. Despite rest, NSAIDs, activity modifications, HEP, he reports continued pain in the left shoulder, into the left upper arm, decreased motion and strength. He denies any new injury. We had previously discussed surgery for his shoulder and he would like to proceed with that at this point.  Allergies No Known Drug Allergies10/20/2014  Family History Congestive Heart Failure First Degree Relatives. mother and father Diabetes Mellitus mother First Degree Relatives Hypertension First Degree Relatives. mother  Social History Tobacco use Former smoker. former smoker; smoke(d) 1 pack(s) per day Tobacco / smoke exposure no Exercise Exercises daily; does running / walking Current work status working full time Living situation live with spouse Children 1 Pain Contract no Drug/Alcohol Rehab (Previously) no Illicit drug use no Marital status married Alcohol use current drinker; drinks wine; only occasionally per week  Medication History MetFORMIN HCl (Oral) Specific dose unknown - Active. Hydrochlorothiazide (Oral) Specific dose unknown - Active. Medications Reconciled  Past Surgical History Spinal  Surgery Foot Surgery left Gallbladder Surgery open  Past Medical Hx Diabetes Mellitus, Type I Migraine Headache  Review of Systems  General Not Present- Fever and Weight Loss. Skin Not Present- Rash. Cardiovascular Not Present- Chest Pain and Shortness of Breath. Male Genitourinary Not Present- Painful Urination. Musculoskeletal Present- Joint Pain, Joint Stiffness, Muscle Pain and Muscle Weakness. Not Present- Joint Swelling. Neurological Not Present- Difficulty with balance, Loss of bladder control, Loss of bowel control, Numbness and Weakness. Endocrine Not Present- Excessive Thirst and Excessive Urination. Hematology Not Present- Easy Bruising.  Physical Exam  General Mental Status -Alert. General Appearance-pleasant, Not in acute distress. Orientation-Oriented X3. Build & Nutrition-Overweight. Posture-Normal posture.  Musculoskeletal Upper Extremity  Left Upper Extremity: Left Shoulder: Inspection and Palpation - Clinical Contours - normal clinical contours present. Tenderness - subacromial space tender to palpation, no tenderness to palpation of the [facility] joint, no tenderness to palpation of the [place] joint, no tenderness to palpation of the clavicle, no tenderness to palpation of the anterior joint line. Swelling - none. Effusion - none. Tissue tension/texture is - soft. Sensation - intact to light touch. Skin - Color - no ecchymosis, no erythema. ROM: Internal Rotation - AROM - severely decreased and painful. External Rotation - AROM - moderately decreased and painful. Flexion - AROM - moderately decreased(approx 90 degrees, isolated) and painful. Glenohumeral Abduction - AROM - moderately decreased(approx 60 degrees when isolated) and painful. ROM - Testing limited - due to pain. Strength and Tone - Testing limited - due to pain. Abduction - 4+/5 and painful. Internal Rotation - 4+/5 and painful. External Rotation - 4+/5 and painful. Left Shoulder - Instability -  sulcus sign negative. Impingement - impingement sign positive and secondary impingement sign positive. Deformities/Malalignments/Discrepancies - no deformities  noted. Special Testing - Speed's test negative.  Imaging MRI L shoulder 03/15/13 images and report reviewed today by Dr. Tonita Cong. There is a subscapularis tear with medial dislocation of the long head bicep tendon. There is also a partial tear of the supraspinatus less than 50% thickness, interstitial and insertional. Severe AC DJD.  Repeat xrays ordered, obtained, reviewed today by Dr. Tonita Cong with no fx, subluxation, dislocation, lytic or blastic lesions. There is progressive AC arthrosis with undersurface spurring.  Assessment & Plan Traumatic rotator cuff tear, left Acromioclavicular arthrosis, left Impingement syndrome of left shoulder  Pt with ongoing L shoulder pain, weakness, stiffness refractory to steroid injections, pain medications, NSAIDs, activity modifications, relative rest, HEP, 11 months s/p lifting injury. We discussed relevant anatomy, reviewed his studies, discussed tx options. He has had limited success with injections in the past. We previously discussed surgery but he had a foot injury in the interim. He would now like to schedule surgery which is reasonable given 11 months duration with ongoing symptoms. At this point, recommend proceeding with left shoulder arthroscopy, SAD, EUA, MUA, DCR, possible mini-open RCR. Discussed the procedure itself as well as risks, complications and alternatives, including but not limited to DVT, PE, infx, bleeding, failure of procedure, need for secondary procedure, ongoing pain/symptoms, anesthesia risk, even stroke or death. Also discussed typical post-op protocols, time out of work, ice and elevation, PT phases, HEP, use of the sling. Discussed importance of activity modifications long term to avoid reinjury. Patient desires to proceed with surgery. He will continue with NSAIDs for pain in  the interim, did tolerate Percocet well in the past and will plan for that post-operatively. We will proceed accordingly and he will follow up 10-14 days post-op for suture removal and will call with any questions or concerns in the interim.  I had a long discussion with the patient concerning the risks and benefits of a rotator cuff repair, including bleeding, infection, prolonged postoperative recovery, which may require 3 to 5 months until maximum medical improvement. Overight procedure with initiation of early passive range of motion within physical therapy. Avoid any active motion for the first six weeks. This is all in an effort to avoid recurrent tear of the rotator cuff and adhesive capsulitis. Return to work without use of the arm can be obtained following two weeks. However, driving will be a challenge. We also discussed the possibility of requiring implants including bone anchors,as well as an Allograft patch graft if a massive rotator cuff tear is encountered. Removal of any bones for spurs as well as bursitis will be performed during the procedure and also any associated anesthetic complications as well.  Plan left shoulder arthroscopy, SAD, exam and manipulation under anesthesia, mini-open DCR, possible mini-open RCR  Signed electronically by Lacie Draft PA-C for Dr. Tonita Cong

## 2014-03-05 NOTE — Patient Instructions (Addendum)
Gerald Larsen  03/05/2014   Your procedure is scheduled on: 03/12/2014  .  Go to the [facility].    Report to [facility]  Entrance and follow signs to               [place] at     1230pm  Call this number if you have problems the morning of surgery [phone]   Remember: Eat a good healthy snack prior to bedtime    Do not eat food after midnite.  May have clear liquids until 0800am then nothing by mouth.       Take these medicines the morning of surgery with A SIP OF WATER:  Hydrocodone if needed, Prilosec                                You may not have any metal on your body including hair pins and              piercings  Do not wear jewelry,  lotions, powders or perfumes.                            Men may shave face and neck.   Do not bring valuables to the hospital. [place].  Contacts, dentures or bridgework may not be worn into surgery.  Leave suitcase in the car. After surgery it may be brought to your room.         Special Instructions: coughing and deep breathing exercises, leg exercises    CLEAR LIQUID DIET   Foods Allowed                                                                     Foods Excluded  Coffee and tea, regular and decaf                             liquids that you cannot  Plain Jell-O in any flavor                                             see through such as: Fruit ices (not with fruit pulp)                                     milk, soups, orange juice  Iced Popsicles                                    All solid food Carbonated beverages, regular and diet  Cranberry, grape and apple juices Sports drinks like Gatorade Lightly seasoned clear broth or consume(fat free) Sugar, honey syrup  Sample Menu Breakfast                                Lunch                                     Supper Cranberry juice                     Beef broth                            Chicken broth Jell-O                                     Grape juice                           Apple juice Coffee or tea                        Jell-O                                      Popsicle                                                Coffee or tea                        Coffee or tea  _____________________________________________________________________                Please read over the following fact sheets you were given: _____________________________________________________________________             [facility] - Preparing for Surgery Before surgery, you can play an important role.  Because skin is not sterile, your skin needs to be as free of germs as possible.  You can reduce the number of germs on your skin by washing with CHG (chlorahexidine gluconate) soap before surgery.  CHG is an antiseptic cleaner which kills germs and bonds with the skin to continue killing germs even after washing. Please DO NOT use if you have an allergy to CHG or antibacterial soaps.  If your skin becomes reddened/irritated stop using the CHG and inform your nurse when you arrive at Short Stay. Do not shave (including legs and underarms) for at least 48 hours prior to the first CHG shower.  You may shave your face/neck. Please follow these instructions carefully:  1.  Shower with CHG Soap the night before surgery and the  morning of Surgery.  2.  If you choose to wash your hair, wash your hair first as usual with your  normal  shampoo.  3.  After you shampoo, rinse your hair and body thoroughly to remove the  shampoo.  4.  Use CHG as you would any other liquid soap.  You can apply chg directly  to the skin and wash                       Gently with a scrungie or clean washcloth.  5.  Apply the CHG Soap to your body ONLY FROM THE NECK DOWN.   Do not use on face/ open                           Wound or open sores. Avoid contact with  eyes, ears mouth and genitals (private parts).                       Wash face,  Genitals (private parts) with your normal soap.             6.  Wash thoroughly, paying special attention to the area where your surgery  will be performed.  7.  Thoroughly rinse your body with warm water from the neck down.  8.  DO NOT shower/wash with your normal soap after using and rinsing off  the CHG Soap.                9.  Pat yourself dry with a clean towel.            10.  Wear clean pajamas.            11.  Place clean sheets on your bed the night of your first shower and do not  sleep with pets. Day of Surgery : Do not apply any lotions/deodorants the morning of surgery.  Please wear clean clothes to the hospital/surgery center.  FAILURE TO FOLLOW THESE INSTRUCTIONS MAY RESULT IN THE CANCELLATION OF YOUR SURGERY PATIENT SIGNATURE_________________________________  NURSE SIGNATURE__________________________________  ________________________________________________________________________   Gerald Larsen  An incentive spirometer is a tool that can help keep your lungs clear and active. This tool measures how well you are filling your lungs with each breath. Taking long deep breaths may help reverse or decrease the chance of developing breathing (pulmonary) problems (especially infection) following:  A long period of time when you are unable to move or be active. BEFORE THE PROCEDURE   If the spirometer includes an indicator to show your best effort, your nurse or respiratory therapist will set it to a desired goal.  If possible, sit up straight or lean slightly forward. Try not to slouch.  Hold the incentive spirometer in an upright position. INSTRUCTIONS FOR USE  1. Sit on the edge of your bed if possible, or sit up as far as you can in bed or on a chair. 2. Hold the incentive spirometer in an upright position. 3. Breathe out normally. 4. Place the mouthpiece in your mouth and seal your  lips tightly around it. 5. Breathe in slowly and as deeply as possible, raising the piston or the ball toward the top of the column. 6. Hold your breath for 3-5 seconds or for as long as possible. Allow the piston or ball to fall to the bottom of the column. 7. Remove the mouthpiece from your mouth and breathe out normally. 8. Rest for a few seconds and repeat Steps 1 through 7 at least 10 times every 1-2 hours when you are awake. Take your time and take a few normal breaths between deep breaths. 9. The spirometer may include an indicator to  show your best effort. Use the indicator as a goal to work toward during each repetition. 10. After each set of 10 deep breaths, practice coughing to be sure your lungs are clear. If you have an incision (the cut made at the time of surgery), support your incision when coughing by placing a pillow or rolled up towels firmly against it. Once you are able to get out of bed, walk around indoors and cough well. You may stop using the incentive spirometer when instructed by your caregiver.  RISKS AND COMPLICATIONS  Take your time so you do not get dizzy or light-headed.  If you are in pain, you may need to take or ask for pain medication before doing incentive spirometry. It is harder to take a deep breath if you are having pain. AFTER USE  Rest and breathe slowly and easily.  It can be helpful to keep track of a log of your progress. Your caregiver can provide you with a simple table to help with this. If you are using the spirometer at home, follow these instructions: [place] IF:   You are having difficultly using the spirometer.  You have trouble using the spirometer as often as instructed.  Your pain medication is not giving enough relief while using the spirometer.  You develop fever of 100.5 F (38.1 C) or higher. SEEK IMMEDIATE MEDICAL CARE IF:   You cough up bloody sputum that had not been present before.  You develop fever of 102 F  (38.9 C) or greater.  You develop worsening pain at or near the incision site. MAKE SURE YOU:   Understand these instructions.  Will watch your condition.  Will get help right away if you are not doing well or get worse. Document Released: 07/13/2006 Document Revised: 05/25/2011 Document Reviewed: 09/13/2006 [facility] Patient Information 2014 [place], [place].   ________________________________________________________________________

## 2014-03-06 ENCOUNTER — Encounter (HOSPITAL_COMMUNITY)
Admission: RE | Admit: 2014-03-06 | Discharge: 2014-03-06 | Disposition: A | Discharge status: Home / Self Care | Payer: BC Managed Care – PPO | Source: Ambulatory Visit | Attending: Specialist | Admitting: Specialist

## 2014-03-06 ENCOUNTER — Encounter (HOSPITAL_COMMUNITY): Payer: Self-pay

## 2014-03-06 DIAGNOSIS — G4730 Sleep apnea, unspecified: Secondary | ICD-10-CM | POA: Diagnosis not present

## 2014-03-06 DIAGNOSIS — Z01812 Encounter for preprocedural laboratory examination: Secondary | ICD-10-CM | POA: Insufficient documentation

## 2014-03-06 HISTORY — DX: Anxiety disorder, unspecified: F41.9

## 2014-03-06 HISTORY — DX: Unspecified osteoarthritis, unspecified site: M19.90

## 2014-03-06 LAB — BASIC METABOLIC PANEL
Anion gap: 7 (ref 5–15)
BUN: 17 mg/dL (ref 6–23)
CO2: 28 mmol/L (ref 19–32)
Calcium: 9.6 mg/dL (ref 8.4–10.5)
Chloride: 102 mEq/L (ref 96–112)
Creatinine, Ser: 1.37 mg/dL — ABNORMAL HIGH (ref 0.50–1.35)
GFR calc Af Amer: 68 mL/min — ABNORMAL LOW (ref >90)
GFR calc non Af Amer: 59 mL/min — ABNORMAL LOW (ref >90)
Glucose, Bld: 93 mg/dL (ref 70–99)
Potassium: 4 mmol/L (ref 3.5–5.1)
Sodium: 137 mmol/L (ref 135–145)

## 2014-03-06 LAB — SURGICAL PCR SCREEN
MRSA, PCR: NEGATIVE
Staphylococcus aureus: NEGATIVE

## 2014-03-06 LAB — CBC
HCT: 44 % (ref 39.0–52.0)
Hemoglobin: 14.6 g/dL (ref 13.0–17.0)
MCH: 29.3 pg (ref 26.0–34.0)
MCHC: 33.2 g/dL (ref 30.0–36.0)
MCV: 88.2 fL (ref 78.0–100.0)
Platelets: 242 10*3/uL (ref 150–400)
RBC: 4.99 MIL/uL (ref 4.22–5.81)
RDW: 13.9 % (ref 11.5–15.5)
WBC: 10.1 10*3/uL (ref 4.0–10.5)

## 2014-03-06 NOTE — Progress Notes (Addendum)
Requested and received from [facility] the following:  H and P - 07/13/2013   Discharge summary- 07/14/2013 Cardiac Cath Report- 07/14/2013.  EF- 60%.  Mild mid LAD disease.   Seen by PCP- Dr Redmond School- 07/20/2013  After Discharge- 07/14/2013  LOV with PCP- 01/10/2014 in EPIC .

## 2014-03-06 NOTE — Progress Notes (Addendum)
EKG- 10/12/2013 EPIC  CXR- 05/22/13 EPIC

## 2014-03-07 NOTE — Progress Notes (Signed)
Left patient a message on cell phone of [phone] to remind him to bring mouthpiece used for sleep apnea on day of surgery.

## 2014-03-12 ENCOUNTER — Ambulatory Visit (HOSPITAL_COMMUNITY): Payer: BC Managed Care – PPO | Admitting: Certified Registered Nurse Anesthetist

## 2014-03-12 ENCOUNTER — Encounter (HOSPITAL_COMMUNITY)
Admission: RE | Disposition: A | Discharge status: Home / Self Care | Payer: Self-pay | Source: Ambulatory Visit | Attending: Specialist

## 2014-03-12 ENCOUNTER — Encounter (HOSPITAL_COMMUNITY): Payer: Self-pay | Admitting: *Deleted

## 2014-03-12 ENCOUNTER — Ambulatory Visit (HOSPITAL_BASED_OUTPATIENT_CLINIC_OR_DEPARTMENT_OTHER)
Admission: RE | Admit: 2014-03-12 | Discharge: 2014-03-13 | Disposition: A | Discharge status: Home / Self Care | Payer: BC Managed Care – PPO | Source: Ambulatory Visit | Attending: Specialist | Admitting: Specialist

## 2014-03-12 DIAGNOSIS — K219 Gastro-esophageal reflux disease without esophagitis: Secondary | ICD-10-CM | POA: Insufficient documentation

## 2014-03-12 DIAGNOSIS — E291 Testicular hypofunction: Secondary | ICD-10-CM | POA: Insufficient documentation

## 2014-03-12 DIAGNOSIS — I10 Essential (primary) hypertension: Secondary | ICD-10-CM | POA: Diagnosis not present

## 2014-03-12 DIAGNOSIS — E119 Type 2 diabetes mellitus without complications: Secondary | ICD-10-CM | POA: Diagnosis not present

## 2014-03-12 DIAGNOSIS — F419 Anxiety disorder, unspecified: Secondary | ICD-10-CM | POA: Diagnosis not present

## 2014-03-12 DIAGNOSIS — M75100 Unspecified rotator cuff tear or rupture of unspecified shoulder, not specified as traumatic: Secondary | ICD-10-CM | POA: Diagnosis present

## 2014-03-12 DIAGNOSIS — G4733 Obstructive sleep apnea (adult) (pediatric): Secondary | ICD-10-CM | POA: Diagnosis not present

## 2014-03-12 DIAGNOSIS — X58XXXA Exposure to other specified factors, initial encounter: Secondary | ICD-10-CM | POA: Insufficient documentation

## 2014-03-12 DIAGNOSIS — Y9289 Other specified places as the place of occurrence of the external cause: Secondary | ICD-10-CM | POA: Insufficient documentation

## 2014-03-12 DIAGNOSIS — Z87891 Personal history of nicotine dependence: Secondary | ICD-10-CM | POA: Insufficient documentation

## 2014-03-12 DIAGNOSIS — G4730 Sleep apnea, unspecified: Secondary | ICD-10-CM | POA: Insufficient documentation

## 2014-03-12 DIAGNOSIS — Y9389 Activity, other specified: Secondary | ICD-10-CM | POA: Insufficient documentation

## 2014-03-12 DIAGNOSIS — M75102 Unspecified rotator cuff tear or rupture of left shoulder, not specified as traumatic: Secondary | ICD-10-CM | POA: Diagnosis not present

## 2014-03-12 DIAGNOSIS — S46812A Strain of other muscles, fascia and tendons at shoulder and upper arm level, left arm, initial encounter: Secondary | ICD-10-CM | POA: Insufficient documentation

## 2014-03-12 DIAGNOSIS — E785 Hyperlipidemia, unspecified: Secondary | ICD-10-CM | POA: Insufficient documentation

## 2014-03-12 DIAGNOSIS — Y998 Other external cause status: Secondary | ICD-10-CM | POA: Diagnosis not present

## 2014-03-12 DIAGNOSIS — M7542 Impingement syndrome of left shoulder: Secondary | ICD-10-CM | POA: Diagnosis not present

## 2014-03-12 DIAGNOSIS — M12819 Other specific arthropathies, not elsewhere classified, unspecified shoulder: Secondary | ICD-10-CM | POA: Diagnosis present

## 2014-03-12 DIAGNOSIS — M19012 Primary osteoarthritis, left shoulder: Secondary | ICD-10-CM | POA: Diagnosis not present

## 2014-03-12 DIAGNOSIS — G43909 Migraine, unspecified, not intractable, without status migrainosus: Secondary | ICD-10-CM | POA: Diagnosis not present

## 2014-03-12 HISTORY — PX: SHOULDER ARTHROSCOPY WITH ROTATOR CUFF REPAIR AND SUBACROMIAL DECOMPRESSION: SHX5686

## 2014-03-12 HISTORY — PX: EXAM UNDER ANESTHESIA WITH MANIPULATION OF SHOULDER: SHX5817

## 2014-03-12 LAB — CBC
HCT: 39.5 % (ref 39.0–52.0)
Hemoglobin: 13.3 g/dL (ref 13.0–17.0)
MCH: 29.8 pg (ref 26.0–34.0)
MCHC: 33.7 g/dL (ref 30.0–36.0)
MCV: 88.4 fL (ref 78.0–100.0)
Platelets: 181 10*3/uL (ref 150–400)
RBC: 4.47 MIL/uL (ref 4.22–5.81)
RDW: 13.7 % (ref 11.5–15.5)
WBC: 6.6 10*3/uL (ref 4.0–10.5)

## 2014-03-12 LAB — GLUCOSE, CAPILLARY
Glucose-Capillary: 102 mg/dL — ABNORMAL HIGH (ref 70–99)
Glucose-Capillary: 89 mg/dL (ref 70–99)
Glucose-Capillary: 236 mg/dL — ABNORMAL HIGH (ref 70–99)

## 2014-03-12 LAB — CREATININE, SERUM
Creatinine, Ser: 1 mg/dL (ref 0.50–1.35)
GFR calc Af Amer: >90 mL/min (ref >90)
GFR calc non Af Amer: 86 mL/min — ABNORMAL LOW (ref >90)

## 2014-03-12 SURGERY — SHOULDER ARTHROSCOPY WITH ROTATOR CUFF REPAIR AND SUBACROMIAL DECOMPRESSION
Anesthesia: General | Laterality: Left

## 2014-03-12 MED ORDER — DEXAMETHASONE SODIUM PHOSPHATE 10 MG/ML IJ SOLN
INTRAMUSCULAR | Status: DC | PRN
  Administered 2014-03-12: 10 mg via INTRAVENOUS

## 2014-03-12 MED ORDER — HYDROMORPHONE HCL 1 MG/ML IJ SOLN: 0.2500 mg | INTRAMUSCULAR | Status: DC | PRN

## 2014-03-12 MED ORDER — KETOROLAC TROMETHAMINE 30 MG/ML IJ SOLN: 30.0000 mg | Freq: Once | INTRAMUSCULAR | Status: DC | PRN

## 2014-03-12 MED ORDER — GLYCOPYRROLATE 0.2 MG/ML IJ SOLN
INTRAMUSCULAR | Status: AC
  Filled 2014-03-12: qty 3

## 2014-03-12 MED ORDER — PROPOFOL 10 MG/ML IV BOLUS
INTRAVENOUS | Status: AC
  Filled 2014-03-12: qty 20

## 2014-03-12 MED ORDER — DOCUSATE SODIUM 100 MG PO CAPS
100.0000 mg | ORAL_CAPSULE | Freq: Two times a day (BID) | ORAL | Status: DC
  Administered 2014-03-12 – 2014-03-13 (×2): 100 mg via ORAL

## 2014-03-12 MED ORDER — ROCURONIUM BROMIDE 100 MG/10ML IV SOLN
INTRAVENOUS | Status: DC | PRN
  Administered 2014-03-12: 30 mg via INTRAVENOUS
  Administered 2014-03-12: 10 mg via INTRAVENOUS

## 2014-03-12 MED ORDER — MENTHOL 3 MG MT LOZG
1.0000 | LOZENGE | OROMUCOSAL | Status: DC | PRN
  Filled 2014-03-12: qty 9

## 2014-03-12 MED ORDER — OXYCODONE-ACETAMINOPHEN 7.5-325 MG PO TABS: 1.0000 | ORAL_TABLET | ORAL | Status: DC | PRN

## 2014-03-12 MED ORDER — PANTOPRAZOLE SODIUM 40 MG PO TBEC
40.0000 mg | DELAYED_RELEASE_TABLET | Freq: Every day | ORAL | Status: DC
  Filled 2014-03-12: qty 1

## 2014-03-12 MED ORDER — LISINOPRIL-HYDROCHLOROTHIAZIDE 20-12.5 MG PO TABS: 2.0000 | ORAL_TABLET | Freq: Every day | ORAL | Status: DC

## 2014-03-12 MED ORDER — MIDAZOLAM HCL 2 MG/2ML IJ SOLN
INTRAMUSCULAR | Status: AC
  Filled 2014-03-12: qty 2

## 2014-03-12 MED ORDER — LACTATED RINGERS IV SOLN
INTRAVENOUS | Status: DC
  Administered 2014-03-12: 16:00:00 via INTRAVENOUS
  Administered 2014-03-12: 1000 mL via INTRAVENOUS

## 2014-03-12 MED ORDER — LIDOCAINE HCL (CARDIAC) 20 MG/ML IV SOLN
INTRAVENOUS | Status: AC
  Filled 2014-03-12: qty 5

## 2014-03-12 MED ORDER — DIAZEPAM 2 MG PO TABS: 5.0000 mg | ORAL_TABLET | Freq: Two times a day (BID) | ORAL | Status: DC | PRN

## 2014-03-12 MED ORDER — KCL IN DEXTROSE-NACL 20-5-0.45 MEQ/L-%-% IV SOLN
INTRAVENOUS | Status: DC
  Administered 2014-03-12: 50 mL/h via INTRAVENOUS
  Filled 2014-03-12: qty 1000

## 2014-03-12 MED ORDER — METOCLOPRAMIDE HCL 10 MG PO TABS: 5.0000 mg | ORAL_TABLET | Freq: Three times a day (TID) | ORAL | Status: DC | PRN

## 2014-03-12 MED ORDER — ONDANSETRON HCL 4 MG PO TABS: 4.0000 mg | ORAL_TABLET | Freq: Four times a day (QID) | ORAL | Status: DC | PRN

## 2014-03-12 MED ORDER — FENTANYL CITRATE 0.05 MG/ML IJ SOLN
INTRAMUSCULAR | Status: DC | PRN
  Administered 2014-03-12 (×5): 50 ug via INTRAVENOUS

## 2014-03-12 MED ORDER — NEOSTIGMINE METHYLSULFATE 10 MG/10ML IV SOLN
INTRAVENOUS | Status: DC | PRN
  Administered 2014-03-12: 5 mg via INTRAVENOUS

## 2014-03-12 MED ORDER — GLYCOPYRROLATE 0.2 MG/ML IJ SOLN
INTRAMUSCULAR | Status: DC | PRN
  Administered 2014-03-12: 0.6 mg via INTRAVENOUS

## 2014-03-12 MED ORDER — PROMETHAZINE HCL 25 MG/ML IJ SOLN: 6.2500 mg | INTRAMUSCULAR | Status: DC | PRN

## 2014-03-12 MED ORDER — CEFAZOLIN SODIUM-DEXTROSE 2-3 GM-% IV SOLR
2.0000 g | INTRAVENOUS | Status: AC
  Administered 2014-03-12: 2 g via INTRAVENOUS

## 2014-03-12 MED ORDER — HYDROMORPHONE HCL 1 MG/ML IJ SOLN
0.5000 mg | INTRAMUSCULAR | Status: DC | PRN
  Administered 2014-03-12: 1 mg via INTRAVENOUS
  Administered 2014-03-13: 0.5 mg via INTRAVENOUS
  Filled 2014-03-12 (×2): qty 1

## 2014-03-12 MED ORDER — OXYCODONE-ACETAMINOPHEN 5-325 MG PO TABS
1.0000 | ORAL_TABLET | ORAL | Status: DC | PRN
  Administered 2014-03-12: 1 via ORAL
  Administered 2014-03-13: 2 via ORAL
  Filled 2014-03-12: qty 2
  Filled 2014-03-12: qty 1

## 2014-03-12 MED ORDER — ROCURONIUM BROMIDE 100 MG/10ML IV SOLN
INTRAVENOUS | Status: AC
  Filled 2014-03-12: qty 1

## 2014-03-12 MED ORDER — METHOCARBAMOL 500 MG PO TABS
500.0000 mg | ORAL_TABLET | Freq: Four times a day (QID) | ORAL | Status: DC | PRN
  Administered 2014-03-13: 500 mg via ORAL
  Filled 2014-03-12: qty 1

## 2014-03-12 MED ORDER — SODIUM CHLORIDE 0.9 % IR SOLN
Status: DC | PRN
  Administered 2014-03-12: 500 mL

## 2014-03-12 MED ORDER — DIAZEPAM 5 MG PO TABS: 5.0000 mg | ORAL_TABLET | Freq: Two times a day (BID) | ORAL | Status: DC | PRN

## 2014-03-12 MED ORDER — SUMATRIPTAN SUCCINATE 100 MG PO TABS
100.0000 mg | ORAL_TABLET | ORAL | Status: DC | PRN
  Filled 2014-03-12: qty 1

## 2014-03-12 MED ORDER — INSULIN ASPART 100 UNIT/ML ~~LOC~~ SOLN
0.0000 [IU] | Freq: Three times a day (TID) | SUBCUTANEOUS | Status: DC
  Administered 2014-03-13: 3 [IU] via SUBCUTANEOUS

## 2014-03-12 MED ORDER — SODIUM CHLORIDE 0.9 % IR SOLN
Status: AC
  Filled 2014-03-12: qty 1

## 2014-03-12 MED ORDER — CEFAZOLIN SODIUM-DEXTROSE 2-3 GM-% IV SOLR
2.0000 g | Freq: Four times a day (QID) | INTRAVENOUS | Status: AC
  Administered 2014-03-12 – 2014-03-13 (×3): 2 g via INTRAVENOUS
  Filled 2014-03-12 (×3): qty 50

## 2014-03-12 MED ORDER — LIDOCAINE-EPINEPHRINE (PF) 2 %-1:200000 IJ SOLN
INTRAMUSCULAR | Status: AC
  Filled 2014-03-12: qty 20

## 2014-03-12 MED ORDER — DEXAMETHASONE SODIUM PHOSPHATE 10 MG/ML IJ SOLN
INTRAMUSCULAR | Status: AC
  Filled 2014-03-12: qty 1

## 2014-03-12 MED ORDER — ROPIVACAINE HCL 5 MG/ML IJ SOLN
INTRAMUSCULAR | Status: AC
  Filled 2014-03-12: qty 30

## 2014-03-12 MED ORDER — PROPOFOL 10 MG/ML IV BOLUS
INTRAVENOUS | Status: DC | PRN
  Administered 2014-03-12: 200 mg via INTRAVENOUS

## 2014-03-12 MED ORDER — PHENYLEPHRINE HCL 10 MG/ML IJ SOLN
INTRAMUSCULAR | Status: AC
  Filled 2014-03-12: qty 1

## 2014-03-12 MED ORDER — ONDANSETRON HCL 4 MG/2ML IJ SOLN
INTRAMUSCULAR | Status: DC | PRN
  Administered 2014-03-12: 4 mg via INTRAVENOUS

## 2014-03-12 MED ORDER — SENNOSIDES-DOCUSATE SODIUM 8.6-50 MG PO TABS: 1.0000 | ORAL_TABLET | Freq: Every evening | ORAL | Status: DC | PRN

## 2014-03-12 MED ORDER — METHOCARBAMOL 1000 MG/10ML IJ SOLN
500.0000 mg | Freq: Four times a day (QID) | INTRAVENOUS | Status: DC | PRN
  Administered 2014-03-12: 500 mg via INTRAVENOUS
  Filled 2014-03-12 (×4): qty 5

## 2014-03-12 MED ORDER — LIDOCAINE HCL (CARDIAC) 20 MG/ML IV SOLN
INTRAVENOUS | Status: DC | PRN
  Administered 2014-03-12: 100 mg via INTRAVENOUS

## 2014-03-12 MED ORDER — CEFAZOLIN SODIUM-DEXTROSE 2-3 GM-% IV SOLR
INTRAVENOUS | Status: AC
  Filled 2014-03-12: qty 50

## 2014-03-12 MED ORDER — ACETAMINOPHEN 325 MG PO TABS: 650.0000 mg | ORAL_TABLET | Freq: Four times a day (QID) | ORAL | Status: DC | PRN

## 2014-03-12 MED ORDER — METOCLOPRAMIDE HCL 5 MG/ML IJ SOLN: 5.0000 mg | Freq: Three times a day (TID) | INTRAMUSCULAR | Status: DC | PRN

## 2014-03-12 MED ORDER — BISACODYL 5 MG PO TBEC: 5.0000 mg | DELAYED_RELEASE_TABLET | Freq: Every day | ORAL | Status: DC | PRN

## 2014-03-12 MED ORDER — ACETAMINOPHEN 650 MG RE SUPP
650.0000 mg | Freq: Four times a day (QID) | RECTAL | Status: DC | PRN
Start: 2014-03-12 — End: 2014-03-13

## 2014-03-12 MED ORDER — BUPIVACAINE-EPINEPHRINE (PF) 0.5% -1:200000 IJ SOLN
INTRAMUSCULAR | Status: AC
  Filled 2014-03-12: qty 30

## 2014-03-12 MED ORDER — NEOSTIGMINE METHYLSULFATE 10 MG/10ML IV SOLN
INTRAVENOUS | Status: AC
  Filled 2014-03-12: qty 1

## 2014-03-12 MED ORDER — PHENOL 1.4 % MT LIQD
1.0000 | OROMUCOSAL | Status: DC | PRN
Start: 2014-03-12 — End: 2014-03-13
  Filled 2014-03-12: qty 177

## 2014-03-12 MED ORDER — HYDROCODONE-ACETAMINOPHEN 5-325 MG PO TABS
1.0000 | ORAL_TABLET | ORAL | Status: DC | PRN
  Administered 2014-03-12 – 2014-03-13 (×3): 2 via ORAL
  Filled 2014-03-12 (×3): qty 2

## 2014-03-12 MED ORDER — ENOXAPARIN SODIUM 40 MG/0.4ML ~~LOC~~ SOLN
40.0000 mg | SUBCUTANEOUS | Status: DC
  Administered 2014-03-13: 40 mg via SUBCUTANEOUS
  Filled 2014-03-12 (×2): qty 0.4

## 2014-03-12 MED ORDER — DOCUSATE SODIUM 100 MG PO CAPS: 100.0000 mg | ORAL_CAPSULE | Freq: Two times a day (BID) | ORAL | Status: DC | PRN

## 2014-03-12 MED ORDER — EPINEPHRINE HCL 1 MG/ML IJ SOLN
INTRAMUSCULAR | Status: DC | PRN
  Administered 2014-03-12: 1 mg

## 2014-03-12 MED ORDER — FENTANYL CITRATE 0.05 MG/ML IJ SOLN
INTRAMUSCULAR | Status: AC
  Filled 2014-03-12: qty 5

## 2014-03-12 MED ORDER — SUCCINYLCHOLINE CHLORIDE 20 MG/ML IJ SOLN
INTRAMUSCULAR | Status: DC | PRN
  Administered 2014-03-12: 100 mg via INTRAVENOUS

## 2014-03-12 MED ORDER — ONDANSETRON HCL 4 MG/2ML IJ SOLN
INTRAMUSCULAR | Status: AC
  Filled 2014-03-12: qty 2

## 2014-03-12 MED ORDER — LACTATED RINGERS IR SOLN
Status: DC | PRN
  Administered 2014-03-12: 9000 mL

## 2014-03-12 MED ORDER — MIDAZOLAM HCL 5 MG/5ML IJ SOLN
INTRAMUSCULAR | Status: DC | PRN
  Administered 2014-03-12 (×2): 1 mg via INTRAVENOUS

## 2014-03-12 MED ORDER — ONDANSETRON HCL 4 MG/2ML IJ SOLN: 4.0000 mg | Freq: Four times a day (QID) | INTRAMUSCULAR | Status: DC | PRN

## 2014-03-12 MED ORDER — POTASSIUM CHLORIDE IN NACL 20-0.45 MEQ/L-% IV SOLN
INTRAVENOUS | Status: DC
  Administered 2014-03-13: 01:00:00 via INTRAVENOUS
  Filled 2014-03-12 (×2): qty 1000

## 2014-03-12 MED ORDER — EPHEDRINE SULFATE 50 MG/ML IJ SOLN
INTRAMUSCULAR | Status: DC | PRN
  Administered 2014-03-12 (×5): 10 mg via INTRAVENOUS

## 2014-03-12 MED ORDER — DEXTROSE 5 % IV SOLN
10.0000 mg | INTRAVENOUS | Status: DC | PRN
  Administered 2014-03-12: 50 ug/min via INTRAVENOUS

## 2014-03-12 MED ORDER — METHOCARBAMOL 500 MG PO TABS: 500.0000 mg | ORAL_TABLET | Freq: Three times a day (TID) | ORAL | Status: DC

## 2014-03-12 SURGICAL SUPPLY — 55 items
ANCHOR NEEDLE 9/16 CIR SZ 8 (NEEDLE) IMPLANT
BLADE CUDA SHAVER 3.5 (BLADE) ×2 IMPLANT
BLADE OSCILLATING/SAGITTAL (BLADE) ×1
BLADE SURG SZ11 CARB STEEL (BLADE) ×4 IMPLANT
BLADE SW THK.38XMED LNG THN (BLADE) ×1 IMPLANT
BUR OVAL 4.0 (BURR) IMPLANT
CANNULA ACUFO 5X76 (CANNULA) ×4 IMPLANT
CLEANER TIP ELECTROSURG 2X2 (MISCELLANEOUS) IMPLANT
CLOTH 2% CHLOROHEXIDINE 3PK (PERSONAL CARE ITEMS) ×2 IMPLANT
DRAPE ORTHO SPLIT 77X108 STRL (DRAPES) ×1
DRAPE POUCH INSTRU U-SHP 10X18 (DRAPES) IMPLANT
DRAPE STERI 35X30 U-POUCH (DRAPES) ×2 IMPLANT
DRAPE SURG ORHT 6 SPLT 77X108 (DRAPES) ×1 IMPLANT
DRSG AQUACEL AG ADV 3.5X 4 (GAUZE/BANDAGES/DRESSINGS) ×2 IMPLANT
DRSG AQUACEL AG ADV 3.5X 6 (GAUZE/BANDAGES/DRESSINGS) ×2 IMPLANT
DRSG PAD ABDOMINAL 8X10 ST (GAUZE/BANDAGES/DRESSINGS) IMPLANT
DURAPREP 26ML APPLICATOR (WOUND CARE) ×2 IMPLANT
ELECT NEEDLE TIP 2.8 STRL (NEEDLE) IMPLANT
ELECT REM PT RETURN 9FT ADLT (ELECTROSURGICAL) ×2
ELECTRODE REM PT RTRN 9FT ADLT (ELECTROSURGICAL) ×1 IMPLANT
GLOVE BIOGEL PI IND STRL 7.5 (GLOVE) ×1 IMPLANT
GLOVE BIOGEL PI INDICATOR 7.5 (GLOVE) ×1
GLOVE SURG SS PI 7.5 STRL IVOR (GLOVE) ×2 IMPLANT
GLOVE SURG SS PI 8.0 STRL IVOR (GLOVE) ×4 IMPLANT
GOWN STRL REUS W/TWL XL LVL3 (GOWN DISPOSABLE) ×4 IMPLANT
KIT BASIN OR (CUSTOM PROCEDURE TRAY) ×2 IMPLANT
KIT POSITION SHOULDER SCHLEI (MISCELLANEOUS) ×2 IMPLANT
MANIFOLD NEPTUNE II (INSTRUMENTS) ×2 IMPLANT
NEEDLE SCORPION MULTI FIRE (NEEDLE) IMPLANT
NEEDLE SPNL 18GX3.5 QUINCKE PK (NEEDLE) ×2 IMPLANT
PACK SHOULDER CUSTOM OPM052 (CUSTOM PROCEDURE TRAY) ×2 IMPLANT
POSITIONER SURGICAL ARM (MISCELLANEOUS) ×2 IMPLANT
SET ARTHROSCOPY TUBING (MISCELLANEOUS) ×1
SET ARTHROSCOPY TUBING LN (MISCELLANEOUS) ×1 IMPLANT
SLING ARM IMMOBILIZER LRG (SOFTGOODS) ×2 IMPLANT
SLING ARM IMMOBILIZER MED (SOFTGOODS) IMPLANT
SLING ULTRA II L (ORTHOPEDIC SUPPLIES) IMPLANT
SPONGE SURGIFOAM ABS GEL 12-7 (HEMOSTASIS) ×2 IMPLANT
STRIP CLOSURE SKIN 1/2X4 (GAUZE/BANDAGES/DRESSINGS) IMPLANT
SUCTION FRAZIER 12FR DISP (SUCTIONS) ×2 IMPLANT
SUT BONE WAX W31G (SUTURE) ×2 IMPLANT
SUT ETHIBOND NAB CT1 #1 30IN (SUTURE) IMPLANT
SUT ETHILON 4 0 PS 2 18 (SUTURE) ×2 IMPLANT
SUT FIBERWIRE #2 38 T-5 BLUE (SUTURE)
SUT PROLENE 3 0 PS 2 (SUTURE) ×2 IMPLANT
SUT TIGER TAPE 7 IN WHITE (SUTURE) IMPLANT
SUT VIC AB 1-0 CT2 27 (SUTURE) IMPLANT
SUT VIC AB 2-0 CT2 27 (SUTURE) ×2 IMPLANT
SUT VICRYL 0-0 OS 2 NEEDLE (SUTURE) ×2 IMPLANT
SUTURE FIBERWR #2 38 T-5 BLUE (SUTURE) IMPLANT
TAPE FIBER 2MM 7IN #2 BLUE (SUTURE) IMPLANT
TAPE STRIPS DRAPE STRL (GAUZE/BANDAGES/DRESSINGS) ×2 IMPLANT
TOWEL OR 17X26 10 PK STRL BLUE (TOWEL DISPOSABLE) ×2 IMPLANT
TUBING CONNECTING 10 (TUBING) IMPLANT
WAND 90 DEG TURBOVAC W/CORD (SURGICAL WAND) IMPLANT

## 2014-03-12 NOTE — Anesthesia Postprocedure Evaluation (Signed)
  Anesthesia Post-op Note  Patient: Gerald Larsen  Procedure(s) Performed: Procedure(s) (LRB): LEFT SHOULDER ARTHROSCOPY WITH DISTAL CLAVICLE RESECTION/SUBACROMIAL DECOMPRESSION/LABRAL DEBRIDEMENT/ROTATOR CUFF DEBRIDEMENT/SUBACROMIAL RESECTION (Left) EXAM UNDER ANESTHESIA WITH MANIPULATION OF SHOULDER (Left)  Patient Location: PACU  Anesthesia Type: GA combined with regional for post-op pain  Level of Consciousness: awake and alert   Airway and Oxygen Therapy: Patient Spontanous Breathing  Post-op Pain: mild  Post-op Assessment: Post-op Vital signs reviewed, Patient's Cardiovascular Status Stable, Respiratory Function Stable, Patent Airway and No signs of Nausea or vomiting  Last Vitals:  Filed Vitals:   03/12/14 1728  BP: 124/75  Pulse: 68  Temp: 36.7 C  Resp: 15    Post-op Vital Signs: stable   Complications: No apparent anesthesia complications

## 2014-03-12 NOTE — Transfer of Care (Signed)
Immediate Anesthesia Transfer of Care Note  Patient: Gerald Larsen  Procedure(s) Performed: Procedure(s) (LRB): LEFT SHOULDER ARTHROSCOPY WITH DISTAL CLAVICLE RESECTION/SUBACROMIAL DECOMPRESSION/LABRAL DEBRIDEMENT/ROTATOR CUFF DEBRIDEMENT/SUBACROMIAL RESECTION (Left) EXAM UNDER ANESTHESIA WITH MANIPULATION OF SHOULDER (Left)  Patient Location: PACU  Anesthesia Type: General and ISB  Level of Consciousness: sedated, patient cooperative and responds to stimulation  Airway & Oxygen Therapy: Patient Spontanous Breathing and Patient connected to face mask oxgen  Post-op Assessment: Report given to PACU RN and Post -op Vital signs reviewed and stable  Post vital signs: Reviewed and stable  Complications: No apparent anesthesia complications

## 2014-03-12 NOTE — Interval H&P Note (Signed)
History and Physical Interval Note:  03/12/2014 2:13 PM  Gerald Larsen  has presented today for surgery, with the diagnosis of LEFT SHOULDER IMPINGEMENT  The various methods of treatment have been discussed with the patient and family. After consideration of risks, benefits and other options for treatment, the patient has consented to  Procedure(s): LEFT SHOULDER ARTHROSCOPY WITH DISTAL CLAVICLE RESECTION/SUBACROMIAL DECOMPRESSION/POSSIBLE MINI ROTATOR CUFF REPAIR   (Left) EXAM UNDER ANESTHESIA WITH MANIPULATION OF SHOULDER (Left) as a surgical intervention .  The patient's history has been reviewed, patient examined, no change in status, stable for surgery.  I have reviewed the patient's chart and labs.  Questions were answered to the patient's satisfaction.     Mairyn Lenahan C

## 2014-03-12 NOTE — Anesthesia Preprocedure Evaluation (Signed)
Anesthesia Evaluation  Patient identified by MRN, date of birth, ID band Patient awake    Reviewed: Allergy & Precautions, H&P , NPO status , Patient's Chart, lab work & pertinent test results  Airway Mallampati: II  TM Distance: >3 FB Neck ROM: Full    Dental no notable dental hx.    Pulmonary sleep apnea , former smoker,  breath sounds clear to auscultation  Pulmonary exam normal       Cardiovascular hypertension, Rhythm:Regular Rate:Normal     Neuro/Psych negative neurological ROS  negative psych ROS   GI/Hepatic negative GI ROS, Neg liver ROS,   Endo/Other  diabetes  Renal/GU negative Renal ROS  negative genitourinary   Musculoskeletal negative musculoskeletal ROS (+)   Abdominal   Peds negative pediatric ROS (+)  Hematology negative hematology ROS (+)   Anesthesia Other Findings   Reproductive/Obstetrics negative OB ROS                             Anesthesia Physical Anesthesia Plan  ASA: II  Anesthesia Plan: General   Post-op Pain Management:    Induction: Intravenous  Airway Management Planned: Oral ETT  Additional Equipment:   Intra-op Plan:   Post-operative Plan: Extubation in OR  Informed Consent: I have reviewed the patients History and Physical, chart, labs and discussed the procedure including the risks, benefits and alternatives for the proposed anesthesia with the patient or authorized representative who has indicated his/her understanding and acceptance.   Dental advisory given  Plan Discussed with: CRNA and Surgeon  Anesthesia Plan Comments:         Anesthesia Quick Evaluation

## 2014-03-12 NOTE — H&P (View-Only) (Signed)
Gerald Larsen DOB: [birth year] Married / Language: Gerald Larsen / Race: White Male  Chief complaint: L shoulder pain  History of Present Illness The patient is a 48 year old male who presents today for follow up of their shoulder. The patient is being followed for their pain. They are 11 month(s) out from lifting injury. Symptoms reported today include: pain. and report their pain level to be moderate to severe. Current treatment includes: activity modification and NSAIDs (Advil). Note for "Shoulder complaints": Gerald Larsen follows up for his left shoulder. He was last seen in April for his knee, which is doing well. He last had a steroid injection in the shoulder in February and reports ongoing pain. He unfortunately had a foot fracture, but that did force him to also rest the shoulder. Despite rest, NSAIDs, activity modifications, HEP, he reports continued pain in the left shoulder, into the left upper arm, decreased motion and strength. He denies any new injury. We had previously discussed surgery for his shoulder and he would like to proceed with that at this point.  Allergies No Known Drug Allergies10/20/2014  Family History Congestive Heart Failure First Degree Relatives. mother and father Diabetes Mellitus mother First Degree Relatives Hypertension First Degree Relatives. mother  Social History Tobacco use Former smoker. former smoker; smoke(d) 1 pack(s) per day Tobacco / smoke exposure no Exercise Exercises daily; does running / walking Current work status working full time Living situation live with spouse Children 1 Pain Contract no Drug/Alcohol Rehab (Previously) no Illicit drug use no Marital status married Alcohol use current drinker; drinks wine; only occasionally per week  Medication History MetFORMIN HCl (Oral) Specific dose unknown - Active. Hydrochlorothiazide (Oral) Specific dose unknown - Active. Medications Reconciled  Past Surgical History Spinal  Surgery Foot Surgery left Gallbladder Surgery open  Past Medical Hx Diabetes Mellitus, Type I Migraine Headache  Review of Systems  General Not Present- Fever and Weight Loss. Skin Not Present- Rash. Cardiovascular Not Present- Chest Pain and Shortness of Breath. Male Genitourinary Not Present- Painful Urination. Musculoskeletal Present- Joint Pain, Joint Stiffness, Muscle Pain and Muscle Weakness. Not Present- Joint Swelling. Neurological Not Present- Difficulty with balance, Loss of bladder control, Loss of bowel control, Numbness and Weakness. Endocrine Not Present- Excessive Thirst and Excessive Urination. Hematology Not Present- Easy Bruising.  Physical Exam  General Mental Status -Alert. General Appearance-pleasant, Not in acute distress. Orientation-Oriented X3. Build & Nutrition-Overweight. Posture-Normal posture.  Musculoskeletal Upper Extremity  Left Upper Extremity: Left Shoulder: Inspection and Palpation - Clinical Contours - normal clinical contours present. Tenderness - subacromial space tender to palpation, no tenderness to palpation of the [facility] joint, no tenderness to palpation of the [place] joint, no tenderness to palpation of the clavicle, no tenderness to palpation of the anterior joint line. Swelling - none. Effusion - none. Tissue tension/texture is - soft. Sensation - intact to light touch. Skin - Color - no ecchymosis, no erythema. ROM: Internal Rotation - AROM - severely decreased and painful. External Rotation - AROM - moderately decreased and painful. Flexion - AROM - moderately decreased(approx 90 degrees, isolated) and painful. Glenohumeral Abduction - AROM - moderately decreased(approx 60 degrees when isolated) and painful. ROM - Testing limited - due to pain. Strength and Tone - Testing limited - due to pain. Abduction - 4+/5 and painful. Internal Rotation - 4+/5 and painful. External Rotation - 4+/5 and painful. Left Shoulder - Instability -  sulcus sign negative. Impingement - impingement sign positive and secondary impingement sign positive. Deformities/Malalignments/Discrepancies - no deformities  noted. Special Testing - Speed's test negative.  Imaging MRI L shoulder 03/15/13 images and report reviewed today by Dr. Tonita Cong. There is a subscapularis tear with medial dislocation of the long head bicep tendon. There is also a partial tear of the supraspinatus less than 50% thickness, interstitial and insertional. Severe AC DJD.  Repeat xrays ordered, obtained, reviewed today by Dr. Tonita Cong with no fx, subluxation, dislocation, lytic or blastic lesions. There is progressive AC arthrosis with undersurface spurring.  Assessment & Plan Traumatic rotator cuff tear, left Acromioclavicular arthrosis, left Impingement syndrome of left shoulder  Pt with ongoing L shoulder pain, weakness, stiffness refractory to steroid injections, pain medications, NSAIDs, activity modifications, relative rest, HEP, 11 months s/p lifting injury. We discussed relevant anatomy, reviewed his studies, discussed tx options. He has had limited success with injections in the past. We previously discussed surgery but he had a foot injury in the interim. He would now like to schedule surgery which is reasonable given 11 months duration with ongoing symptoms. At this point, recommend proceeding with left shoulder arthroscopy, SAD, EUA, MUA, DCR, possible mini-open RCR. Discussed the procedure itself as well as risks, complications and alternatives, including but not limited to DVT, PE, infx, bleeding, failure of procedure, need for secondary procedure, ongoing pain/symptoms, anesthesia risk, even stroke or death. Also discussed typical post-op protocols, time out of work, ice and elevation, PT phases, HEP, use of the sling. Discussed importance of activity modifications long term to avoid reinjury. Patient desires to proceed with surgery. He will continue with NSAIDs for pain in  the interim, did tolerate Percocet well in the past and will plan for that post-operatively. We will proceed accordingly and he will follow up 10-14 days post-op for suture removal and will call with any questions or concerns in the interim.  I had a long discussion with the patient concerning the risks and benefits of a rotator cuff repair, including bleeding, infection, prolonged postoperative recovery, which may require 3 to 5 months until maximum medical improvement. Overight procedure with initiation of early passive range of motion within physical therapy. Avoid any active motion for the first six weeks. This is all in an effort to avoid recurrent tear of the rotator cuff and adhesive capsulitis. Return to work without use of the arm can be obtained following two weeks. However, driving will be a challenge. We also discussed the possibility of requiring implants including bone anchors,as well as an Allograft patch graft if a massive rotator cuff tear is encountered. Removal of any bones for spurs as well as bursitis will be performed during the procedure and also any associated anesthetic complications as well.  Plan left shoulder arthroscopy, SAD, exam and manipulation under anesthesia, mini-open DCR, possible mini-open RCR  Signed electronically by Lacie Draft PA-C for Dr. Tonita Cong

## 2014-03-12 NOTE — Anesthesia Procedure Notes (Addendum)
Anesthesia Regional Block:  Interscalene brachial plexus block  Pre-Anesthetic Checklist: ,, timeout performed, Correct Patient, Correct Site, Correct Laterality, Correct Procedure, Correct Position, site marked, Risks and benefits discussed,  Surgical consent,  Pre-op evaluation,  At surgeon's request and post-op pain management  Laterality: Left  Prep: chloraprep       Needles:  Injection technique: Single-shot  Needle Type: Echogenic Stimulator Needle     Needle Length: 9cm 9 cm Needle Gauge: 21 and 21 G    Additional Needles:  Procedures: ultrasound guided (picture in chart) Interscalene brachial plexus block Narrative:  Injection made incrementally with aspirations every 5 mL.  Performed by: Personally  Anesthesiologist: Sajad Glander  Additional Notes: Patient tolerated the procedure well without complications  Noted paresthesia in L hand pre injection, needle moved, aspirate clear, injection made.

## 2014-03-12 NOTE — Brief Op Note (Signed)
03/12/2014  4:14 PM  PATIENT:  Gerald Larsen  48 y.o. male  PRE-OPERATIVE DIAGNOSIS:  LEFT SHOULDER IMPINGEMENT  POST-OPERATIVE DIAGNOSIS:  LEFT SHOULDER IMPINGEMENT  PROCEDURE:  Procedure(s): LEFT SHOULDER ARTHROSCOPY WITH DISTAL CLAVICLE RESECTION/SUBACROMIAL DECOMPRESSION/LABRAL DEBRIDEMENT/ROTATOR CUFF DEBRIDEMENT/SUBACROMIAL RESECTION (Left) EXAM UNDER ANESTHESIA WITH MANIPULATION OF SHOULDER (Left)  SURGEON:  Surgeon(s) and Role:    * Johnn Hai, MD - Primary  PHYSICIAN ASSISTANT:   ASSISTANTS: [place]   ANESTHESIA:   general  EBL:     BLOOD ADMINISTERED:none  DRAINS: none   LOCAL MEDICATIONS USED:  MARCAINE     SPECIMEN:  No Specimen  DISPOSITION OF SPECIMEN:  N/A  COUNTS:  YES  TOURNIQUET:  * No tourniquets in log *  DICTATION: .Other Dictation: Dictation Number  [id number]  PLAN OF CARE: Admit for overnight observation  PATIENT DISPOSITION:  PACU - hemodynamically stable.   Delay start of Pharmacological VTE agent (>24hrs) due to surgical blood loss or risk of bleeding: no

## 2014-03-12 NOTE — Discharge Instructions (Signed)
SHOULDER ARTHROSCOPY POSTOPERATIVE INSTRUCTIONS FOR DR. Tonita Cong  1.  Ice pack on shoulder 3-4 times per day.  2.  May keep bandages in place until follow up. May shower with bandages in place.  3,  May get out of sling in AM and start gentle pendulum exercises.  You are encouraged to move the elbow, wrist and hand.  4.  Elevate operative shoulder and elbow on pillow.  5.  Exercise your fingers to help reduce swelling.  6.  Report to your doctor should any of the following situations occur:   -Swelling of your fingers.  -Inability to wiggle your fingers.  -Coldness, turning pale or blueness of your fingers.  -Loss of sensation, numbness or tingling of your fingers.  -Unusual small or odor from under dressing.  -Excessive bleeding or drainage from the surgical site(s).  -Severe pain which is not relieved by the pain medication your doctor prescribed for you.  7.  Call the office to schedule and appointment for 10-14 days post-op. 8.  Take one aspirin per day 325mg  with a meal if not on another blood thinner or allergic.  Patient Signature:  ________________________________________________________  Nurse's Signature:  ________________________________________________________

## 2014-03-12 NOTE — Progress Notes (Signed)
Assisted Dr. Kalman Shan with left, interscalene  block. Side rails up, monitors on throughout procedure. See vital signs in flow sheet. Tolerated Procedure well.

## 2014-03-13 ENCOUNTER — Encounter (HOSPITAL_COMMUNITY): Payer: Self-pay | Admitting: Specialist

## 2014-03-13 DIAGNOSIS — M7542 Impingement syndrome of left shoulder: Secondary | ICD-10-CM | POA: Diagnosis not present

## 2014-03-13 LAB — CBC
HCT: 40.3 % (ref 39.0–52.0)
Hemoglobin: 13.5 g/dL (ref 13.0–17.0)
MCH: 29.6 pg (ref 26.0–34.0)
MCHC: 33.5 g/dL (ref 30.0–36.0)
MCV: 88.4 fL (ref 78.0–100.0)
Platelets: 181 10*3/uL (ref 150–400)
RBC: 4.56 MIL/uL (ref 4.22–5.81)
RDW: 13.9 % (ref 11.5–15.5)
WBC: 9.9 10*3/uL (ref 4.0–10.5)

## 2014-03-13 LAB — BASIC METABOLIC PANEL
Anion gap: 9 (ref 5–15)
BUN: 15 mg/dL (ref 6–23)
CO2: 22 mmol/L (ref 19–32)
Calcium: 8.9 mg/dL (ref 8.4–10.5)
Chloride: 102 mEq/L (ref 96–112)
Creatinine, Ser: 0.94 mg/dL (ref 0.50–1.35)
GFR calc Af Amer: >90 mL/min (ref >90)
GFR calc non Af Amer: >90 mL/min (ref >90)
Glucose, Bld: 166 mg/dL — ABNORMAL HIGH (ref 70–99)
Potassium: 4.4 mmol/L (ref 3.5–5.1)
Sodium: 133 mmol/L — ABNORMAL LOW (ref 135–145)

## 2014-03-13 NOTE — Op Note (Signed)
NAMEDERIC, BOCOCK NO.:  [id number]  MEDICAL RECORD NO.:  [id number]  LOCATION:  2637                         FACILITY:  [facility]  PHYSICIAN:  Susa Day, M.D.    DATE OF BIRTH:  September 02, 1965  DATE OF PROCEDURE: DATE OF DISCHARGE:                              OPERATIVE REPORT   PREOPERATIVE DIAGNOSES: 1. Impingement syndrome, left shoulder. 2. Rotator cuff tear, left shoulder. 3. Labral tear, left shoulder. 4. AC arthrosis.  POSTOPERATIVE DIAGNOSES: 1. Impingement syndrome, left shoulder. 2. Rotator cuff tear, left shoulder. 3. Labral tear, left shoulder. 4. AC arthrosis. 5. Tear of the subscap tendon.  PROCEDURE PERFORMED: 1. Exam under anesthesia. 2. Left shoulder arthroscopy with debridement of anterior superior     labral tear. 3. Debridement of rotator cuff tear, subscap. 4. Subacromial decompression, bursectomy. 5. Mini open distal clavicle resection.  ANESTHESIA:  General.  ASSISTANT:  Lacie Draft, PA  HISTORY:  A 48 year old with shoulder pain for over a year, predominantly in the [facility] joint.  He had AC arthrosis, partially from [facility] injection.  He had impingement pain and partial tear of the subscap. He has had some medial subluxation of his biceps tendon, but did not have biceps symptomatology.  It was indicated for shoulder arthroscopy, distal clavicle resection, subacromial decompression, evaluation of glenohumeral joint and the subscap, evaluation of biceps tendon, possible open rotator cuff repair.  Risks and benefits discussed including bleeding, infection, damage to neurovascular structures, DVT, PE, anesthetic complications, etc.  TECHNIQUE:  With the patient in supine beach-chair position, after induction of adequate anesthesia and regional block, the left upper extremity was prepped and draped in usual sterile fashion.  Surgical marker was utilized to delineate the acromion, AC joint, coracoid. Standard posterolateral portal  was utilized with incision through the skin only.  With the arm in 70/30 position, gentle traction was applied. We advanced the arthroscopic cannula in the glenohumeral joint penetrating atraumatically.  Irrigant was utilized to insufflate the joint.  65 mmHg utilized.  There was significant tearing of the labrum noted.  Some tearing of subscap as well.  Some degenerative change in glenohumeral joint.  I fashioned anterior portal after localization with 18-gauge needle just beneath the biceps tendon.  I advanced the cannula. I introduced a shaver and debrided the anterior and superior labrum to a stable base.  I then examined the subscap.  There was some tearing noted within the subscap insertion near the bicipital groove.  This was debrided to a stable base as well.  There was a portion of the subscap where there was intrasubstance tearing of the subscap as well but the majority attached.  We tried this under external rotation, internal rotation, and abduction.  Then exam  of the biceps then appeared to be intact through its groove, it was unable to sublux it out of the groove. After full debridement of this, finding that the biceps tendon was stable, I reevaluated the rotator cuff.  No evidence of tear of the supraspinatus.  I redirected the camera in the subacromial space and made an incision posterolaterally to the skin only triangulating the subacromial space.  Hypertrophic bursa was noted.  I introduced a shaver and  debrided to a stable base.  Rotator cuff was untorn.  Full bursectomy was performed.  Unable to deliver the distal clavicle in the subacromial space, we therefore decided to convert it to mini open.  We shaved the anterior lateral aspect of the acromion as well.  Next, all arthroscopic equipment was removed.  I closed the portals with 4-0 nylon simple sutures, palpated distal clavicle.  I made a small incision parallel to the joint.  Subcutaneous tissue dissected,  cartilage utilized to achieve hemostasis, divided deltotrapezial fascia and identified the capsule of the joint.  This was incised and the skeletonized the distal clavicle with nail elevator.  We protected anteriorly and posteriorly with baby Hohmann retractors and inferiorly with a Network engineer.  The joint was severely arthritic.  I debrided the joint with a Beyer rongeur.  I removed the distal clavicle, approximately 1 cm of the distal clavicle, with oscillating saw and then removed inferior osteophytes with 3 mm Kerrison protecting the rotator cuff tendon in the anterior capsule.  Copiously irrigated the wound.  We put bone wax on the exposed end of the distal clavicle and palpated the wound.  There was good decompression and the coracoclavicular ligaments were stable.  We then repaired the capsule with 1 Vicryl and deltotrapezial fascia with 2-0 Vicryl, subcu with 2-0, and skin with subcuticular Prolene.  Sterile dressing was applied.  Placed in a sling, extubated without difficulty, and transported to the recovery room in satisfactory condition.  The patient tolerated the procedure well.  No complications.  Minimal blood loss.     Susa Day, M.D.     Geralynn Rile  D:  03/12/2014  T:  03/13/2014  Job:  [id number]

## 2014-03-13 NOTE — Progress Notes (Signed)
Subjective: 1 Day Post-Op Procedure(s) (LRB): LEFT SHOULDER ARTHROSCOPY WITH DISTAL CLAVICLE RESECTION/SUBACROMIAL DECOMPRESSION/LABRAL DEBRIDEMENT/ROTATOR CUFF DEBRIDEMENT/SUBACROMIAL RESECTION (Left) EXAM UNDER ANESTHESIA WITH MANIPULATION OF SHOULDER (Left) Patient reports pain as mild.  Pt seen in AM rounds for Dr. Tonita Cong. He reports pain is well controlled. Feels the block has totally worn off at this point, denies any numbness or tingling. Pain medications seem to be working well, states it took some time to get the pain under control but now is doing better.  Objective: Vital signs in last 24 hours: Temp:  [97.7 F (36.5 C)-98.2 F (36.8 C)] 98.2 F (36.8 C) (12/29 0635) Pulse Rate:  [64-75] 68 (12/29 0635) Resp:  [12-21] 18 (12/29 0635) BP: (95-139)/(54-97) 129/78 mmHg (12/29 0635) SpO2:  [92 %-100 %] 94 % (12/29 0635) Weight:  [114.76 kg (253 lb)] 114.76 kg (253 lb) (12/28 1728)  Intake/Output from previous day: 12/28 0701 - 12/29 0700 In: 1837.9 [P.O.:240; I.V.:1597.9] Out: 700 [Urine:700] Intake/Output this shift:     Recent Labs  03/12/14 1830 03/13/14 0512  HGB 13.3 13.5    Recent Labs  03/12/14 1830 03/13/14 0512  WBC 6.6 9.9  RBC 4.47 4.56  HCT 39.5 40.3  PLT 181 181    Recent Labs  03/12/14 1830 03/13/14 0512  NA  --  133*  K  --  4.4  CL  --  102  CO2  --  22  BUN  --  15  CREATININE 1.00 0.94  GLUCOSE  --  166*  CALCIUM  --  8.9   No results for input(s): LABPT, INR in the last 72 hours.  Neurologically intact ABD soft Neurovascular intact Sensation intact distally Intact pulses distally Dorsiflexion/Plantar flexion intact Incision: dressing C/D/I and no drainage No cellulitis present Compartment soft sling in place No sign of DVT  Assessment/Plan: 1 Day Post-Op Procedure(s) (LRB): LEFT SHOULDER ARTHROSCOPY WITH DISTAL CLAVICLE RESECTION/SUBACROMIAL DECOMPRESSION/LABRAL DEBRIDEMENT/ROTATOR CUFF DEBRIDEMENT/SUBACROMIAL RESECTION  (Left) EXAM UNDER ANESTHESIA WITH MANIPULATION OF SHOULDER (Left) Advance diet Up with therapy D/C IV fluids  PT this AM, discussed HEP Discussed dressing and D/C instructions Plan D/C home today after PT Follow up in office 10-14 days post-op for suture removal Will discuss with Dr. Mliss Fritz, Conley Rolls. 03/13/2014, 7:40 AM

## 2014-03-13 NOTE — Evaluation (Signed)
Occupational Therapy Evaluation Patient Details Name: Gerald Larsen MRN: [id number] DOB: 1966-02-24 Today's Date: 03/13/2014    History of Present Illness Pt is s/p LEFT SHOULDER ARTHROSCOPY WITH DISTAL CLAVICLE RESECTION/SUBACROMIAL DECOMPRESSION/LABRAL DEBRIDEMENT/ROTATOR CUFF DEBRIDEMENT/SUBACROMIAL RESECTION   Clinical Impression   All education for L shoulder care provided and issued handout/reviewed. Wife present for session. Pt up in room, transferring into bathroom with standby assist. No PT eval needs at this time. Attempted to clarify if pendulums desired by MD (nursing paged PA) but didn't hear back yet. Informed pt and wife that if nursing not able to clarify with MD before d/c today, they should call MD office once home and ask if pendulums desired by MD or not. Demonstrated pendulums to pt and wife in case MD wants pt to do these. Emphasized not to perform pendulums however until clarified. Will sign off for acute OT.    Follow Up Recommendations  No OT follow up;Supervision - Intermittent    Equipment Recommendations  None recommended by OT    Recommendations for Other Services       Precautions / Restrictions Precautions Precautions: Shoulder Shoulder Interventions: Shoulder sling/immobilizer;Off for dressing/bathing/exercises Precaution Booklet Issued: Yes (comment) Precaution Comments: Reviewed shoulder care handout with pt and wife. Restrictions Weight Bearing Restrictions: Yes LUE Weight Bearing: Non weight bearing      Mobility Bed Mobility                  Transfers                      Balance                                            ADL                                               Vision                     Perception     Praxis      Pertinent Vitals/Pain Pain Assessment: 0-10 Pain Score: 4  Pain Location: L shoulder Pain Descriptors / Indicators: Aching Pain  Intervention(s): Repositioned;Ice applied     Hand Dominance Right   Extremity/Trunk Assessment             Communication Communication Communication: No difficulties   Cognition Arousal/Alertness: Awake/alert Behavior During Therapy: WFL for tasks assessed/performed Overall Cognitive Status: Within Functional Limits for tasks assessed                     General Comments       Exercises       Shoulder Instructions Shoulder Instructions Donning/doffing shirt without moving shoulder: Caregiver independent with task Method for sponge bathing under operated UE: Caregiver independent with task Donning/doffing sling/immobilizer: Caregiver independent with task Correct positioning of sling/immobilizer: Caregiver independent with task ROM for elbow, wrist and digits of operated UE: Supervision/safety Sling wearing schedule (on at all times/off for ADL's): Independent Proper positioning of operated UE when showering: Independent Positioning of UE while sleeping: Caregiver independent with task;Independent  Wife did well with assisting with donning/doffing sling, donning shirt and verbalized understanding of sponge bathing technique for L UE. Pt with  tight fitting button down shirt today so emphasized looser fitting buttons downs preferable and not pullovers. Mod assist overall with ADL.    Home Living Family/patient expects to be discharged to:: Private residence Living Arrangements: Spouse/significant other                                      Prior Functioning/Environment               OT Diagnosis: Generalized weakness   OT Problem List:     OT Treatment/Interventions:      OT Goals(Current goals can be found in the care plan section) Acute Rehab OT Goals Patient Stated Goal: home today OT Goal Formulation: With patient/family  OT Frequency:     Barriers to D/C:            Co-evaluation              End of Session     Activity Tolerance: Patient tolerated treatment well Patient left: in chair;with call bell/phone within reach;with family/visitor present   Time: 2060-1561 OT Time Calculation (min): 33 min Charges:  OT General Charges $OT Visit: 1 Procedure OT Evaluation $Initial OT Evaluation Tier I: 1 Procedure OT Treatments $Self Care/Home Management : 23-37 mins G-Codes: OT G-codes **NOT FOR INPATIENT CLASS** Functional Assessment Tool Used: clinical judgement Functional Limitation: Self care Self Care Current Status (B3794): At least 20 percent but less than 40 percent impaired, limited or restricted Self Care Goal Status (F2761): At least 20 percent but less than 40 percent impaired, limited or restricted Self Care Discharge Status [phone]): At least 20 percent but less than 40 percent impaired, limited or restricted  Floyd, [place] 03/13/2014, 11:56 AM

## 2014-03-13 NOTE — Progress Notes (Signed)
Utilization review completed.  

## 2014-03-13 NOTE — Discharge Summary (Signed)
Patient ID: Gerald Larsen MRN: [id number] DOB/AGE: Mar 12, 1966 48 y.o.  Admit date: 03/12/2014 Discharge date: 03/13/2014  Admission Diagnoses:  Principal Problem:   Impingement syndrome of left shoulder Active Problems:   Arthritis of left acromioclavicular joint   Rotator cuff tear arthropathy   Discharge Diagnoses:  Same  Past Medical History  Diagnosis Date  . Hypertension   . Hypogonadism male   . Migraines   . Hyperlipidemia   . GERD (gastroesophageal reflux disease)   . Type 2 diabetes mellitus   . Acute meniscal tear of left knee   . Wears contact lenses   . OSA on CPAP     and uses mouthpiece   . Anxiety   . Arthritis     Surgeries: Procedure(s): LEFT SHOULDER ARTHROSCOPY WITH DISTAL CLAVICLE RESECTION/SUBACROMIAL DECOMPRESSION/LABRAL DEBRIDEMENT/ROTATOR CUFF DEBRIDEMENT/SUBACROMIAL RESECTION EXAM UNDER ANESTHESIA WITH MANIPULATION OF SHOULDER on 03/12/2014   Consultants:    Discharged Condition: Improved  Hospital Course: LENOX BINK is an 48 y.o. male who was admitted 03/12/2014 for operative treatment ofImpingement syndrome of left shoulder and AC arthrosis. Patient has severe unremitting pain that affects sleep, daily activities, and work/hobbies. After pre-op clearance the patient was taken to the operating room on 03/12/2014 and underwent  Procedure(s): LEFT SHOULDER ARTHROSCOPY WITH DISTAL CLAVICLE RESECTION/SUBACROMIAL DECOMPRESSION/LABRAL DEBRIDEMENT/ROTATOR CUFF DEBRIDEMENT/SUBACROMIAL RESECTION EXAM UNDER ANESTHESIA WITH MANIPULATION OF SHOULDER.    Patient was given perioperative antibiotics: Anti-infectives    Start     Dose/Rate Route Frequency Ordered Stop   03/13/14 0600  ceFAZolin (ANCEF) IVPB 2 g/50 mL premix     2 g100 mL/hr over 30 Minutes Intravenous On call to O.R. 03/12/14 1212 03/12/14 1436   03/12/14 2100  ceFAZolin (ANCEF) IVPB 2 g/50 mL premix     2 g100 mL/hr over 30 Minutes Intravenous Every 6 hours 03/12/14 1748 03/13/14  1459   03/12/14 1503  polymyxin B 500,000 Units, bacitracin 50,000 Units in sodium chloride irrigation 0.9 % 500 mL irrigation  Status:  Discontinued       As needed 03/12/14 1504 03/12/14 1628       Patient was given sequential compression devices, early ambulation, and chemoprophylaxis to prevent DVT.  Patient benefited maximally from hospital stay and there were no complications.    Recent vital signs: Patient Vitals for the past 24 hrs:  BP Temp Temp src Pulse Resp SpO2 Height Weight  03/13/14 0635 129/78 mmHg 98.2 F (36.8 C) Oral 68 18 94 % - -  03/13/14 0233 108/70 mmHg - - 75 16 96 % - -  03/12/14 2030 (!) 95/54 mmHg 97.9 F (36.6 C) Oral 73 16 93 % - -  03/12/14 1930 (!) 104/58 mmHg 98 F (36.7 C) Oral 65 16 92 % - -  03/12/14 1838 107/61 mmHg 98 F (36.7 C) - 65 16 96 % - -  03/12/14 1728 124/75 mmHg 98.1 F (36.7 C) Oral 68 15 98 % 6' (1.829 m) 114.76 kg (253 lb)  03/12/14 1715 136/73 mmHg 98 F (36.7 C) - 66 17 97 % - -  03/12/14 1700 123/77 mmHg - - 69 (!) 21 96 % - -  03/12/14 1645 129/82 mmHg - - 68 16 99 % - -  03/12/14 1632 135/84 mmHg 98 F (36.7 C) - 75 20 100 % - -  03/12/14 1422 - - - 67 19 100 % - -  03/12/14 1421 - - - 68 17 100 % - -  03/12/14 1420 - - - 67  16 100 % - -  03/12/14 1419 - - - 68 14 100 % - -  03/12/14 1418 - - - 68 18 99 % - -  03/12/14 1417 - - - 66 17 100 % - -  03/12/14 1416 - - - 65 18 100 % - -  03/12/14 1415 - - - 66 18 100 % - -  03/12/14 1414 - - - 66 17 100 % - -  03/12/14 1413 - - - 65 18 99 % - -  03/12/14 1412 - - - 64 18 100 % - -  03/12/14 1411 - - - 64 17 100 % - -  03/12/14 1410 - - - 67 17 100 % - -  03/12/14 1409 - - - 67 12 98 % - -  03/12/14 1408 - - - 67 18 92 % - -  03/12/14 1407 - - - 67 18 96 % - -  03/12/14 1406 - - - 65 18 95 % - -  03/12/14 1243 - - - - - - 6' (1.829 m) 114.76 kg (253 lb)  03/12/14 1211 (!) 139/97 mmHg 97.7 F (36.5 C) Oral 74 18 97 % - -     Recent laboratory studies:  Recent Labs   03/12/14 1830 03/13/14 0512  WBC 6.6 9.9  HGB 13.3 13.5  HCT 39.5 40.3  PLT 181 181  NA  --  133*  K  --  4.4  CL  --  102  CO2  --  22  BUN  --  15  CREATININE 1.00 0.94  GLUCOSE  --  166*  CALCIUM  --  8.9     Discharge Medications:     Medication List    STOP taking these medications        amoxicillin 500 MG capsule  Commonly known as:  AMOXIL     HYDROcodone-acetaminophen 5-325 MG per tablet  Commonly known as:  NORCO     OXYCODONE HCL PO      TAKE these medications        ADVIL 200 MG Caps  Generic drug:  Ibuprofen  Take 400 mg by mouth every 4 (four) hours as needed (migraine.).     diazepam 10 MG tablet  Commonly known as:  VALIUM  1/2-1 tablet as needed up to 2 times     docusate sodium 100 MG capsule  Commonly known as:  COLACE  Take 1 capsule (100 mg total) by mouth 2 (two) times daily as needed for mild constipation.     lisinopril-hydrochlorothiazide 20-12.5 MG per tablet  Commonly known as:  PRINZIDE,ZESTORETIC  TAKE 2 TABLETS BY MOUTH DAILY.     metFORMIN 500 MG 24 hr tablet  Commonly known as:  GLUCOPHAGE-XR  TAKE 1 TABLET (500 MG TOTAL) BY MOUTH DAILY WITH BREAKFAST.     methocarbamol 500 MG tablet  Commonly known as:  ROBAXIN  Take 1 tablet (500 mg total) by mouth 3 (three) times daily.     omeprazole 40 MG capsule  Commonly known as:  PRILOSEC  TAKE 1 CAPSULE (40 MG TOTAL) BY MOUTH DAILY.     oxyCODONE-acetaminophen 7.5-325 MG per tablet  Commonly known as:  PERCOCET  Take 1 tablet by mouth every 4 (four) hours as needed for pain.     pravastatin 40 MG tablet  Commonly known as:  PRAVACHOL  TAKE 1 TABLET (40 MG TOTAL) BY MOUTH DAILY.     SUMAtriptan 100 MG tablet  Commonly known  as:  IMITREX  Take 1 tablet (100 mg total) by mouth every 2 (two) hours as needed for migraine or headache. May repeat in 2 hours if headache persists or recurs.        Diagnostic Studies: No results found.  Disposition: 01-Home or Self  Care      Discharge Instructions    Call MD / Call 911    Complete by:  As directed   If you experience chest pain or shortness of breath, CALL 911 and be transported to the hospital emergency room.  If you develope a fever above 101 F, pus (white drainage) or increased drainage or redness at the wound, or calf pain, call your surgeon's office.     Constipation Prevention    Complete by:  As directed   Drink plenty of fluids.  Prune juice may be helpful.  You may use a stool softener, such as Colace (over the counter) 100 mg twice a day.  Use MiraLax (over the counter) for constipation as needed.     Diet - low sodium heart healthy    Complete by:  As directed      Increase activity slowly as tolerated    Complete by:  As directed            Follow-up Information    Follow up with BEANE,JEFFREY C, MD In 2 weeks.   Specialty:  Orthopedic Surgery   Why:  For suture removal   Contact information:   [address] [place] [address] [phone]        Signed: Cecilie Kicks. 03/13/2014, 7:44 AM

## 2014-03-14 LAB — GLUCOSE, CAPILLARY: Glucose-Capillary: 159 mg/dL — ABNORMAL HIGH (ref 70–99)

## 2014-03-16 HISTORY — PX: OTHER SURGICAL HISTORY: SHX169

## 2014-04-30 ENCOUNTER — Telehealth: Payer: Self-pay | Admitting: Internal Medicine

## 2014-04-30 MED ORDER — OXYCODONE-ACETAMINOPHEN 7.5-325 MG PO TABS: 1.0000 | ORAL_TABLET | ORAL | Status: DC | PRN

## 2014-04-30 NOTE — Telephone Encounter (Signed)
Pt needs a refill on hydrocodone for his HA. Call when ready

## 2014-05-01 ENCOUNTER — Telehealth: Payer: Self-pay | Admitting: Family Medicine

## 2014-05-01 NOTE — Telephone Encounter (Signed)
Called pt reached voice mail lm rx is ready to pick up

## 2014-07-06 ENCOUNTER — Other Ambulatory Visit: Payer: Self-pay | Admitting: Family Medicine

## 2014-07-07 NOTE — Discharge Summary (Signed)
PATIENT NAME:  Gerald Larsen, Gerald Larsen MR#:  [id number] DATE OF BIRTH:  Jun 18, 1965  DATE OF ADMISSION:  07/13/2013 DATE OF DISCHARGE:  07/14/2013  ADMITTING DIAGNOSIS:  Chest pain.   DISCHARGE DIAGNOSES: 1.  Chest pain, noncardiac in origin, probably likely gastrointestinal-related. The patient is status post cardiac cath with no obstruction.  2.  Hypertension.  3.  Migraines.  4.  Gastroesophageal reflux disease.  5.  Hyperlipidemia.  6.  Diabetes.   CONSULTANTS:  Dr. Humphrey Rolls.   PERTINENT LABS AND EVALUATIONS:  EKG: Normal sinus rhythm without any ST-T wave changes. His glucose is 121, BUN 20, creatinine 0.96, sodium 138. Potassium was 3.9, chloride 104, CO2 of 29, calcium 9.8. LFTs: Total protein 7.2, albumin 3.8, bilirubin total was 0.5, AST 45, ALT 60. Troponin less than 0.02. WBC 5.7, hemoglobin 14.8, platelet count 197. Chest x-ray showed no acute cardiopulmonary processes. His cardiac enzymes x 3 remain negative. His cardiac catheterization showed no significant coronary artery disease with mildly dilated left ventricle. Lipid panel: Total cholesterol 179, triglycerides 143, HDL 57, LDL was 93. TSH was 1.12.   HOSPITAL COURSE:  Please refer to H and P done by the admitting physician. The patient is a 49 year old white male with history of hypertension, diabetes, hyperlipidemia, who presented with complaint of chest pressure that woke him up. The patient was having some burning sensation in his chest for the past few days; however, on the day of admission, he woke up with chest pressure. The patient has significant risk factors for having underlying coronary artery disease and his symptoms were concerning; therefore, he underwent a cardiac cath, which was negative. The patient currently is being discharged home. His symptoms were likely related to reflux. His omeprazole is increased to twice daily. At this time, he is doing much better and is stable for discharge.   DISCHARGE MEDICATIONS:  Metformin  500 mg 1 tab p.o. daily, pravastatin 40 at bedtime, hydrochlorothiazide/lisinopril 12.5/20 mg 1 to 2 tabs daily, Percocet 5/325 mg 1 tab p.o. q.6 p.r.n. for pain, omeprazole 20 mg 1 tab p.o. b.i.d.   DIET:  Low sodium, low fat, low cholesterol.   ACTIVITY:  As tolerated.   TIMEFRAME FOR FOLLOWUP:  One to 2 weeks with primary M.D.   TIME SPENT:  35 minutes on this patient.   ____________________________ Lafonda Mosses. Posey Pronto, MD shp:dmm D: 07/14/2013 18:36:25 ET T: 07/14/2013 21:27:05 ET JOB#: [id number]  cc: Misako Roeder H. Posey Pronto, MD, <Dictator> Alric Seton MD ELECTRONICALLY SIGNED 07/19/2013 16:16

## 2014-07-07 NOTE — H&P (Signed)
PATIENT NAME:  Gerald Larsen, Gerald Larsen MR#:  [id number] DATE OF BIRTH:  01-Jul-1965  DATE OF ADMISSION:  07/13/2013  PRIMARY CARE PROVIDER: Located in [place], Dr. Heidi Dach  EMERGENCY DEPARTMENT REFERRING PHYSICIAN: Dr. Corky Downs  CHIEF COMPLAINT: Chest pressure.   HISTORY OF PRESENT ILLNESS: The patient is a 49 year old white male with history of migraines, GERD, hyperlipidemia, diabetes, and hypertension who has been having some reflux-like symptoms for the past few days who woke up this morning with tightness in his chest lasting for about a hour and a half substernally. He also had some shortness of breath. He took some nitroglycerin and his symptoms resolved. He also had some vomiting and nausea. He denies any dyspnea on exertion. No chest pains on exertion. Denies any radiation of the pain. No numbness. No tingling. Otherwise, he has been feeling well.   PAST MEDICAL HISTORY: Significant for: 1.  Migraines. 2.  GERD. 3.  Hyperlipidemia. 4.  Diabetes for 6 years.  5.  Hypertension.   ALLERGIES: None.   PAST SURGICAL HISTORY: Status post left knee surgery.   MEDICATIONS: List currently is not available. The patient is not able to recall all his medications.   SOCIAL HISTORY: Smoked 25 years ago, quit. Alcohol: Only rarely drinks socially. No drug use.   FAMILY HISTORY: Mother and father had heart disease in their 31s.  REVIEW OF SYSTEMS: CONSTITUTIONAL: Denies any fevers, chills. No weight loss. No weight gain.  HEENT: Denies any visual difficulties, blurred vision. No inflammation in the eyes.  ENT: No tinnitus. No hearing loss. No seasonal or year-round allergies. No difficulty swallowing.  CARDIOVASCULAR: Denies any chest pain, palpitations, or syncope.  PULMONARY: Denies any cough, wheezing, hemoptysis.  GASTROINTESTINAL: Denies any nausea, vomiting, diarrhea.  GENITOURINARY: Denies any frequency, urgency or hesitancy.  ENDOCRINE: Denies any polyuria, nocturia or thyroid problems.   HEMATOLOGIC AND LYMPHATIC: Denies anemia, easy bruisability.  SKIN: Denies any rash.  LYMPHATICS: Denies any lymph node enlargement.  VASCULAR: Denies any claudication symptoms.   PHYSICAL EXAMINATION: VITAL SIGNS: Temperature 98, pulse 62, respirations 20, blood pressure 114/68, O2 93%.  GENERAL: The patient is an obese male in no acute distress.  HEENT: Head atraumatic, normocephalic. Pupils equally round and reactive to light and accommodation. There is no conjunctival pallor. No scleral icterus. Nasal exam shows no drainage or ulceration. Oropharynx is clear without any exudate.  NECK: Supple without any JVD.  CARDIOVASCULAR: Regular rate and rhythm. No murmurs, rubs, clicks, or gallops.  LUNGS: Clear to auscultation bilaterally without any rales, rhonchi, wheezing.  ABDOMEN: Soft, nontender, nondistended. Positive bowel sounds x4.  EXTREMITIES: No clubbing, cyanosis, or edema.  SKIN: No rash.  LYMPHATICS: No lymph nodes palpable.  VASCULAR: Good DP and PT pulses.  PSYCHIATRIC: Not anxious or depressed.  NEUROLOGIC: Awake, alert, and oriented x3. No focal deficits.  DIAGNOSTIC DATA: Glucose 121, BUN 20, creatinine 0.96, sodium 138, potassium 3.9, chloride 104, CO2 29, calcium 9.8. LFTs: Total protein 7.2, albumin 3.8, bili total 0.4, alkaline phosphatase 71, AST 45, ALT 60. Troponin less than 0.02. WBC 5.7, hemoglobin 14.8, platelet count 197,000.   Chest x-ray shows no acute cardiopulmonary processes.   ASSESSMENT AND PLAN: The patient is a 49 year old with chest pressure.  1.  Chest pressure with multiple risk factors. This could be unstable angina or gastrointestinal-related symptoms. I have discussed the case with Dr. Humphrey Rolls who will evaluate the patient in regards to the best management. The patient may have a stress test later or may end up having  a cardiac catheterization . I will continue aspirin and nitroglycerin p.r.n.  2.  Hypertension. We will obtain his home medication  list. Metoprolol b.i.d.  3.  Diabetes. He is going to be on sliding scale insulin and will check a Hemoglobin A1c, obtain his medication list.  4.  Hyperlipidemia. Resume his home treatment. Will check a fasting lipid panel in the a.m.  5.  Gastroesophageal reflux disease. I am going to place him on proton pump inhibitors b.i.d.   TIME SPENT: 45 minutes.   ____________________________ Lafonda Mosses Posey Pronto, MD shp:sb D: 07/13/2013 09:08:58 ET T: 07/13/2013 09:26:57 ET JOB#: [id number]  cc: Aribella Vavra H. Posey Pronto, MD, <Dictator> Alric Seton MD ELECTRONICALLY SIGNED 07/19/2013 16:15

## 2014-07-07 NOTE — Consult Note (Signed)
PATIENT NAME:  Gerald Larsen, KAPS MR#:  [id number] DATE OF BIRTH:  [birth year]  DATE OF CONSULTATION:  07/13/2013  REFERRING PHYSICIAN:   CONSULTING PHYSICIAN:  Dionisio David, MD  INDICATION FOR THE CONSULTATION: Chest pain.   HISTORY OF PRESENT ILLNESS: This is a 49 year old white male with a past medical history of migraine, acid reflux, hyperlipidemia, non-insulin-dependent diabetes, hypertension, who presented to the Emergency Room with chest pain. The chest pain was described as tightness associated with shortness of breath and it was 10 over 10 chest pain when he came in. It woke him up from sleep. Apparently he has a history of acid reflux, but this pain was different. It was associated with some nausea and vomiting.   PAST MEDICAL HISTORY: History of migraine, acid reflux, hyperlipidemia, non-insulin-dependent diabetes, hypertension and allergies.   SOCIAL HISTORY: He smoked for 25 years, quit recently. Alcohol use, he drinks socially.   FAMILY HISTORY: His mother and father both died of myocardial infarction at age 43s.   ALLERGIES: None.   REVIEW OF SYSTEMS: Unremarkable.   PHYSICAL EXAMINATION:  GENERAL: He is alert, oriented x 3, in no acute distress right now.  VITAL SIGNS: Temperature 98, pulse 62, respirations 20, blood pressure 114/68, and saturation 93.  NECK: No JVD.  LUNGS: Clear.  HEART: Regular rate and rhythm. Normal S1, S2. No audible murmur.  ABDOMEN: Soft, nontender, positive bowel sounds.  EXTREMITIES: No pedal edema.  NEUROLOGIC: The patient appears to be intact.   LABS/STUDIES: EKG shows a normal sinus rhythm, 60 beats per minute, incomplete right bundle branch block, nonspecific ST-T changes. His troponin, initial one, was negative. BUN/creatinine is normal.   ASSESSMENT AND PLAN: The patient has unstable angina with multiple risk factors for coronary artery disease including diabetes, hypertension, hyperlipidemia and strong family history of coronary  artery disease at premature age. His chest pain is unstable in nature, which woke him up from sleep. Advised cardiac catheterization to further evaluate symptoms as the patient may be having acute coronary syndrome.  Thank you very much for the referral.  ____________________________ Dionisio David, MD sak:aw D: 07/13/2013 11:19:31 ET T: 07/13/2013 11:43:44 ET JOB#: [id number]  cc: Dionisio David, MD, <Dictator> Dionisio David MD ELECTRONICALLY SIGNED 07/21/2013 11:05

## 2014-08-02 ENCOUNTER — Ambulatory Visit (INDEPENDENT_AMBULATORY_CARE_PROVIDER_SITE_OTHER): Payer: BLUE CROSS/BLUE SHIELD | Admitting: Family Medicine

## 2014-08-02 ENCOUNTER — Encounter: Payer: Self-pay | Admitting: Family Medicine

## 2014-08-02 VITALS — BP 150/100 | HR 74 | Wt 244.8 lb

## 2014-08-02 DIAGNOSIS — R6882 Decreased libido: Secondary | ICD-10-CM | POA: Diagnosis not present

## 2014-08-02 DIAGNOSIS — G4730 Sleep apnea, unspecified: Secondary | ICD-10-CM | POA: Diagnosis not present

## 2014-08-02 DIAGNOSIS — K219 Gastro-esophageal reflux disease without esophagitis: Secondary | ICD-10-CM

## 2014-08-02 DIAGNOSIS — G43109 Migraine with aura, not intractable, without status migrainosus: Secondary | ICD-10-CM

## 2014-08-02 DIAGNOSIS — E785 Hyperlipidemia, unspecified: Secondary | ICD-10-CM | POA: Diagnosis not present

## 2014-08-02 DIAGNOSIS — I10 Essential (primary) hypertension: Secondary | ICD-10-CM

## 2014-08-02 DIAGNOSIS — E669 Obesity, unspecified: Secondary | ICD-10-CM | POA: Diagnosis not present

## 2014-08-02 DIAGNOSIS — R7302 Impaired glucose tolerance (oral): Secondary | ICD-10-CM | POA: Diagnosis not present

## 2014-08-02 LAB — COMPREHENSIVE METABOLIC PANEL
ALT: 40 U/L (ref 0–53)
AST: 25 U/L (ref 0–37)
Albumin: 4.3 g/dL (ref 3.5–5.2)
Alkaline Phosphatase: 61 U/L (ref 39–117)
BUN: 12 mg/dL (ref 6–23)
CO2: 26 mEq/L (ref 19–32)
Calcium: 10.2 mg/dL (ref 8.4–10.5)
Chloride: 103 mEq/L (ref 96–112)
Creat: 0.83 mg/dL (ref 0.50–1.35)
Glucose, Bld: 129 mg/dL — ABNORMAL HIGH (ref 70–99)
Potassium: 4.7 mEq/L (ref 3.5–5.3)
Sodium: 139 mEq/L (ref 135–145)
Total Bilirubin: 0.5 mg/dL (ref 0.2–1.2)
Total Protein: 7 g/dL (ref 6.0–8.3)

## 2014-08-02 LAB — CBC WITH DIFFERENTIAL/PLATELET
Basophils Absolute: 0 10*3/uL (ref 0.0–0.1)
Basophils Relative: 0 % (ref 0–1)
Eosinophils Absolute: 0.1 10*3/uL (ref 0.0–0.7)
Eosinophils Relative: 1 % (ref 0–5)
HCT: 48.4 % (ref 39.0–52.0)
Hemoglobin: 16.6 g/dL (ref 13.0–17.0)
Lymphocytes Relative: 18 % (ref 12–46)
Lymphs Abs: 1.7 10*3/uL (ref 0.7–4.0)
MCH: 29.7 pg (ref 26.0–34.0)
MCHC: 34.3 g/dL (ref 30.0–36.0)
MCV: 86.6 fL (ref 78.0–100.0)
MPV: 9.2 fL (ref 8.6–12.4)
Monocytes Absolute: 0.6 10*3/uL (ref 0.1–1.0)
Monocytes Relative: 6 % (ref 3–12)
Neutro Abs: 7 10*3/uL (ref 1.7–7.7)
Neutrophils Relative %: 75 % (ref 43–77)
Platelets: 273 10*3/uL (ref 150–400)
RBC: 5.59 MIL/uL (ref 4.22–5.81)
RDW: 14.4 % (ref 11.5–15.5)
WBC: 9.3 10*3/uL (ref 4.0–10.5)

## 2014-08-02 LAB — POCT GLYCOSYLATED HEMOGLOBIN (HGB A1C): Hemoglobin A1C: 5.4

## 2014-08-02 LAB — LIPID PANEL
Cholesterol: 219 mg/dL — ABNORMAL HIGH (ref 0–200)
HDL: 70 mg/dL (ref >=40)
LDL Cholesterol: 126 mg/dL — ABNORMAL HIGH (ref 0–99)
Total CHOL/HDL Ratio: 3.1 Ratio
Triglycerides: 113 mg/dL (ref <150)
VLDL: 23 mg/dL (ref 0–40)

## 2014-08-02 MED ORDER — LISINOPRIL-HYDROCHLOROTHIAZIDE 20-12.5 MG PO TABS: 2.0000 | ORAL_TABLET | Freq: Every day | ORAL | Status: DC

## 2014-08-02 MED ORDER — PRAVASTATIN SODIUM 40 MG PO TABS: ORAL_TABLET | ORAL | Status: DC

## 2014-08-02 MED ORDER — OMEPRAZOLE 40 MG PO CPDR: DELAYED_RELEASE_CAPSULE | ORAL | Status: DC

## 2014-08-02 MED ORDER — HYOSCYAMINE SULFATE 0.125 MG SL SUBL: SUBLINGUAL_TABLET | SUBLINGUAL | Status: DC

## 2014-08-02 NOTE — Patient Instructions (Signed)
Try Zantac or Pepcid instead of Prilosec to see if it works as well and if it does then use it.

## 2014-08-02 NOTE — Addendum Note (Signed)
Addended by: Patience Musca F on: 08/02/2014 04:25 PM   Modules accepted: Orders

## 2014-08-02 NOTE — Progress Notes (Signed)
   Subjective:    Patient ID: Gerald Larsen, male    DOB: 31-Aug-1965, 49 y.o.   MRN: [id number]  HPI He is here for medication check. He ran out of his medications a month ago but did not call to get them renewed. He was told that the medications were not renewed by [place]. I warned him that we did not get the request. He has been checking his blood sugars periodically and they run in the low 100s.He does take metformin. He does have OSA but has not been using CPAP. He has been using a dental appliance and apparently it does help. On a weight loss diet and has lost 15 pounds. He does have some midepigastric pain approximately one hour after eating.He continues on his blood pressure medication. He does have migraine headaches but has some under adequate control. He does complain of low libido. He has had low testosterone readings in the past but has not followed up on this. He apparently was on testosterone but stopped on his own. His reflux is under good control with Prilosec. He takes that daily.    Review of Systems     Objective:   Physical Exam Alert and in no distress. Tympanic membranes and canals are normal. Pharyngeal area is normal. Neck is supple without adenopathy or thyromegaly. Cardiac exam shows a regular sinus rhythm without murmurs or gallops. Lungs are clear to auscultation. Hemoglobin A1c is 5.4       Assessment & Plan:  Sleep apnea  Glucose intolerance (impaired glucose tolerance) - Plan: CBC with Differential/Platelet, Comprehensive metabolic panel, Lipid panel  Essential hypertension - Plan: CBC with Differential/Platelet, Comprehensive metabolic panel, lisinopril-hydrochlorothiazide (PRINZIDE,ZESTORETIC) 20-12.5 MG per tablet  Migraine with aura and without status migrainosus, not intractable  Obesity (BMI 30.0-34.9) - Plan: CBC with Differential/Platelet, Comprehensive metabolic panel, Lipid panel  Low libido - Plan: Testosterone  Hyperlipidemia - Plan:  pravastatin (PRAVACHOL) 40 MG tablet  Gastroesophageal reflux disease without esophagitis - Plan: omeprazole (PRILOSEC) 40 MG capsule Recommend he stop taking the metformin and have him continue to  monitor his blood sugars.I will check testosterone. I will also: Antispasm medicines if that will help with his GI symptoms.

## 2014-08-03 ENCOUNTER — Other Ambulatory Visit: Payer: BLUE CROSS/BLUE SHIELD

## 2014-08-03 LAB — TESTOSTERONE: Testosterone: 584 ng/dL (ref 300–890)

## 2014-08-27 ENCOUNTER — Telehealth: Payer: Self-pay | Admitting: Family Medicine

## 2014-08-27 MED ORDER — HYDROCODONE-ACETAMINOPHEN 7.5-325 MG PO TABS: 1.0000 | ORAL_TABLET | Freq: Four times a day (QID) | ORAL | Status: DC | PRN

## 2014-08-27 NOTE — Telephone Encounter (Signed)
Pt called and requesting refill on pain medication. Pt can be reached at [phone] and has a upcoming appt.

## 2014-08-27 NOTE — Telephone Encounter (Signed)
Hydrocodone is what he is asking for

## 2014-08-27 NOTE — Telephone Encounter (Signed)
Find out if he is asking for sumatriptan or his codeine

## 2014-08-27 NOTE — Telephone Encounter (Signed)
Called pt. He is aware his prescription is ready for pickup here in the office [facility] 6-13

## 2014-09-06 ENCOUNTER — Ambulatory Visit: Payer: BLUE CROSS/BLUE SHIELD | Admitting: Family Medicine

## 2014-09-10 ENCOUNTER — Ambulatory Visit (INDEPENDENT_AMBULATORY_CARE_PROVIDER_SITE_OTHER): Payer: BLUE CROSS/BLUE SHIELD | Admitting: Family Medicine

## 2014-09-10 ENCOUNTER — Encounter: Payer: Self-pay | Admitting: Family Medicine

## 2014-09-10 VITALS — BP 136/90 | HR 67 | Wt 255.0 lb

## 2014-09-10 DIAGNOSIS — R6882 Decreased libido: Secondary | ICD-10-CM

## 2014-09-10 DIAGNOSIS — N521 Erectile dysfunction due to diseases classified elsewhere: Secondary | ICD-10-CM | POA: Diagnosis not present

## 2014-09-10 DIAGNOSIS — G43109 Migraine with aura, not intractable, without status migrainosus: Secondary | ICD-10-CM

## 2014-09-10 MED ORDER — TADALAFIL 20 MG PO TABS: 20.0000 mg | ORAL_TABLET | Freq: Every day | ORAL | Status: DC | PRN

## 2014-09-10 MED ORDER — SUMATRIPTAN SUCCINATE 100 MG PO TABS: 100.0000 mg | ORAL_TABLET | ORAL | Status: DC | PRN

## 2014-09-10 NOTE — Patient Instructions (Signed)
The next time you have symptoms prior to the migraine use Excedrin Migraine and a couple of ibuprofen and see if you can block it and if not then try the Imitrex. You can repeat the Imitrex in 2 hours if you need to

## 2014-09-10 NOTE — Progress Notes (Signed)
   Subjective:    Patient ID: Gerald Larsen, male    DOB: 07/07/1965, 49 y.o.   MRN: [id number]  HPI He is here for consult. Recently he had blood drawn and his testosterone is normal. He was complaining of low libido. Upon further questioning, his low libido is probably mainly due to lack of erectile function spastic considering his normal testosterone level.He also has a history of migraine headaches. He is oriented is really a feeling of something not being right but he is unable to really label it in terms of a true neurologic issue.He has a previous history of difficulty with migraine headaches and had been seen by a local migraine specialist several years ago and given shots as well as medications of various types without much success. He does use codeine when he has a migraine.   Review of Systems     Objective:   Physical Exam Alert and in no distress otherwise not examined       Assessment & Plan:  Migraine with aura and without status migrainosus, not intractable - Plan: SUMAtriptan (IMITREX) 100 MG tablet  Low libido  Erectile dysfunction due to diseases classified elsewhere I discussed the treatment of migraine headache. When he has his particular aura, I recommended he use Excedrin Migraine and 400 mg of ibuprofen in combination to see if this will break. If he does not break, he is then to use Imitrex.Splint that he can also take 2 hours after the initial dose. A sample of Cialis was given with instructions on use and possible side effects. He will keep me informed.

## 2014-10-08 ENCOUNTER — Telehealth: Payer: Self-pay | Admitting: Family Medicine

## 2014-10-08 MED ORDER — HYDROCODONE-ACETAMINOPHEN 7.5-325 MG PO TABS: 1.0000 | ORAL_TABLET | Freq: Four times a day (QID) | ORAL | Status: DC | PRN

## 2014-10-08 NOTE — Telephone Encounter (Signed)
I will refill his Norco however if he continues to use cement this regimen which is not excessive, I will have him come back for further consultation.

## 2014-10-08 NOTE — Telephone Encounter (Signed)
Pt called for refill of norco. Please call [phone] when ready.

## 2015-02-25 ENCOUNTER — Telehealth: Payer: Self-pay | Admitting: Family Medicine

## 2015-02-25 MED ORDER — HYDROCODONE-ACETAMINOPHEN 7.5-325 MG PO TABS: 1.0000 | ORAL_TABLET | Freq: Four times a day (QID) | ORAL | Status: DC | PRN

## 2015-02-25 NOTE — Telephone Encounter (Signed)
Pt request refill Norco. 

## 2015-05-20 ENCOUNTER — Ambulatory Visit (INDEPENDENT_AMBULATORY_CARE_PROVIDER_SITE_OTHER): Payer: 59 | Admitting: Family Medicine

## 2015-05-20 ENCOUNTER — Encounter: Payer: Self-pay | Admitting: Family Medicine

## 2015-05-20 VITALS — BP 144/100 | HR 68 | Temp 98.0°F | Wt 252.8 lb

## 2015-05-20 DIAGNOSIS — R059 Cough, unspecified: Secondary | ICD-10-CM

## 2015-05-20 DIAGNOSIS — R6889 Other general symptoms and signs: Secondary | ICD-10-CM

## 2015-05-20 DIAGNOSIS — R05 Cough: Secondary | ICD-10-CM | POA: Diagnosis not present

## 2015-05-20 DIAGNOSIS — I10 Essential (primary) hypertension: Secondary | ICD-10-CM | POA: Diagnosis not present

## 2015-05-20 LAB — POC INFLUENZA A&B (BINAX/QUICKVUE)
Influenza A, POC: NEGATIVE
Influenza B, POC: NEGATIVE

## 2015-05-20 NOTE — Patient Instructions (Signed)
Stay well hydrated, treat your symptoms. Take your blood pressure medicine. Try Mucinex DM for congestion and fluid to right ear. Salt water gargles for sore throat and tylenol or ibuprofen for headache, body ache. Let me know if your symptoms are not improving in the next 2-3 days or if you get worse.

## 2015-05-20 NOTE — Progress Notes (Signed)
   Subjective:    Patient ID: Gerald Larsen, male    DOB: 10-11-1965, 50 y.o.   MRN: [id number]  HPI Chief Complaint  Patient presents with  . possible flu    possible flu like symptoms. fever chills, weak tired. cough no sore throat sinus friday   He is here with a 4 day history of sore throat, headache, fever, chills, bilateral ear pain, fatigue and dry cough. States symptoms have improved since yesterday. Has had mild diarrhea since yesterday. Denies nausea, vomiting, chest pain, shortness of breath.  Does not smoke, history of bronchitis, no pneumonia.  No flu shot this season.  Positive sick contacts- wife.  He is aware that his blood pressure is elevated today and states he has not taken his blood pressure medication in 2 days due to his illness. He is not out of medication.   Reviewed allergies, medications, past medical and social history.   Review of Systems Pertinent positives and negatives in the history of present illness.     Objective:   Physical Exam BP 144/100 mmHg  Pulse 68  Temp(Src) 98 F (36.7 C) (Oral)  Wt 252 lb 12.8 oz (114.669 kg)  Alert and in no distress. No sinus tenderness. Right tympanic membrane and canal is normal, left tympanic membrane with fluid, no erythema or bulging, canal normal . Pharyngeal area is erythematous without edema or exudate.  Neck is supple without adenopathy or thyromegaly. Cardiac exam shows a regular sinus rhythm without murmurs or gallops. Lungs are clear to auscultation.  Rechecked blood pressure 138/98 right arm sitting.  Flu swab negative    Assessment & Plan:  Flu-like symptoms - Plan: POC Influenza A&B(BINAX/QUICKVUE)  Cough  Essential hypertension  Discussed that he is negative for flu and that his symptoms are most likely related to a viral etiology. Recommend symptomatic treatment and that he take his blood pressure medication. Discussed that decongestant can elevate blood pressure also.  Recommend he follow  up if not improving in 2-3 days or sooner if he gets worse.

## 2015-06-05 ENCOUNTER — Telehealth: Payer: Self-pay | Admitting: Family Medicine

## 2015-06-05 MED ORDER — AZITHROMYCIN 250 MG PO TABS: ORAL_TABLET | ORAL | Status: DC

## 2015-06-05 NOTE — Telephone Encounter (Signed)
Please call him and find out which symptoms are worse. I am ok treating him with an antibiotic since he is still sick. Just need to make sure we treat with the appropriate antibiotic based on his symptoms. Thanks.

## 2015-06-05 NOTE — Telephone Encounter (Signed)
Left message for pt to call me back 

## 2015-06-05 NOTE — Telephone Encounter (Signed)
Sent med to pharmacy  

## 2015-06-05 NOTE — Telephone Encounter (Signed)
Congestion, sore throat, drainage, chest congestion, tired, a lot of coughing.

## 2015-06-05 NOTE — Telephone Encounter (Signed)
Please call in Gerald Larsen for him #6 tablets, take 2 tablets on day 1, then 1 tablet on days 2-5  (no refills) and he should follow up if not back to baseline after completing it.

## 2015-06-05 NOTE — Telephone Encounter (Signed)
Wife called and states pt still sick.  She had to come in yesterday and get a zpack.  Wife wants to know if we will call pt in a zpack to [facility] since he was recently seen.

## 2015-06-24 ENCOUNTER — Telehealth: Payer: Self-pay | Admitting: Family Medicine

## 2015-06-24 MED ORDER — HYDROCODONE-ACETAMINOPHEN 5-325 MG PO TABS: 1.0000 | ORAL_TABLET | Freq: Four times a day (QID) | ORAL | Status: DC | PRN

## 2015-06-24 NOTE — Telephone Encounter (Signed)
Pt called and was requesting  A refill on his [place], says it has been a while since he has has a prescription for it, says he is having a bad migraine and needs it , tried to get him to come in but says this normally works best, pt can be reached [phone] (M please call when ready to be picked up.

## 2015-06-24 NOTE — Telephone Encounter (Signed)
Advised pt Rx Norco ready for pick up  

## 2015-07-24 ENCOUNTER — Telehealth: Payer: Self-pay | Admitting: Family Medicine

## 2015-07-24 MED ORDER — ONDANSETRON 4 MG PO TBDP
4.0000 mg | ORAL_TABLET | Freq: Three times a day (TID) | ORAL | Status: DC | PRN
Start: 2015-07-24 — End: 2016-03-05

## 2015-07-24 NOTE — Telephone Encounter (Signed)
Wife has been informed med called in she verbalized understanding

## 2015-07-24 NOTE — Telephone Encounter (Signed)
Let him know that I called in some Zofran that is disintegrating. I called in 6.

## 2015-07-24 NOTE — Telephone Encounter (Signed)
Pt wife called and states that the pt has been up throwing up and having diarrhea since 2 am this morning, and can not hardly go, she was wondering if you would send him something in for it, tired to get them to come in but she states that she don't think he could make in it he was so weak. [facility] uses [id number] - [place], [place] - [address]  Pt wife can be reached at [phone]. Said if someone would please call her

## 2015-08-05 ENCOUNTER — Other Ambulatory Visit: Payer: Self-pay | Admitting: Family Medicine

## 2015-08-19 ENCOUNTER — Other Ambulatory Visit: Payer: Self-pay | Admitting: Family Medicine

## 2015-08-19 NOTE — Telephone Encounter (Signed)
Is this okay to refill? 

## 2015-09-10 ENCOUNTER — Ambulatory Visit (INDEPENDENT_AMBULATORY_CARE_PROVIDER_SITE_OTHER): Payer: 59 | Admitting: Family Medicine

## 2015-09-10 ENCOUNTER — Encounter: Payer: Self-pay | Admitting: Family Medicine

## 2015-09-10 VITALS — BP 120/80 | HR 93 | Wt 258.0 lb

## 2015-09-10 DIAGNOSIS — F419 Anxiety disorder, unspecified: Secondary | ICD-10-CM

## 2015-09-10 MED ORDER — LORAZEPAM 0.5 MG PO TABS: 0.5000 mg | ORAL_TABLET | Freq: Two times a day (BID) | ORAL | Status: DC | PRN

## 2015-09-10 NOTE — Progress Notes (Signed)
   Subjective:    Patient ID: Gerald Larsen, male    DOB: [birth year], 50 y.o.   MRN: [id number]  HPI He is here for consult concerning anxiety. He relates several episodes of anxiety, 2 of them relating to being in the hospital having a procedure. The most recent anxiety is dealing with the fact that his wife is getting ready to leave for about a week. This has never happened before. He is becoming quite anxious over this and recognizes that this is not logical. He did become quite tearful when discussing this.   Review of Systems     Objective:   Physical Exam Alert and in no distress otherwise not examined       Assessment & Plan:  Anxiety - Plan: LORazepam (ATIVAN) 0.5 MG tablet  I discussed his anxiety with him and with his wife. He does recognize the logical versus physiological components to this. I will give him lorazepam. He will also be referred to Durene Romans to help with dealing with this anxiety. He is comfortable with this. Over 15 minutes, greater than 50% spent in counseling and coordination of care.

## 2015-09-10 NOTE — Patient Instructions (Signed)
Call Kenneth Frazier  [phone] 

## 2015-09-11 ENCOUNTER — Other Ambulatory Visit: Payer: Self-pay | Admitting: Occupational Medicine

## 2015-09-11 ENCOUNTER — Ambulatory Visit: Payer: Self-pay

## 2015-09-11 DIAGNOSIS — M79642 Pain in left hand: Secondary | ICD-10-CM

## 2015-11-19 ENCOUNTER — Other Ambulatory Visit: Payer: Self-pay | Admitting: Family Medicine

## 2015-11-19 ENCOUNTER — Encounter: Payer: Self-pay | Admitting: Medical

## 2015-11-19 ENCOUNTER — Ambulatory Visit (INDEPENDENT_AMBULATORY_CARE_PROVIDER_SITE_OTHER): Payer: 59 | Admitting: Medical

## 2015-11-19 VITALS — BP 120/60 | HR 72 | Temp 98.3°F | Resp 16 | Ht 72.0 in | Wt 253.8 lb

## 2015-11-19 DIAGNOSIS — B86 Scabies: Secondary | ICD-10-CM

## 2015-11-19 DIAGNOSIS — R21 Rash and other nonspecific skin eruption: Secondary | ICD-10-CM | POA: Diagnosis not present

## 2015-11-19 DIAGNOSIS — J029 Acute pharyngitis, unspecified: Secondary | ICD-10-CM

## 2015-11-19 LAB — POCT RAPID STREP A (OFFICE): Rapid Strep A Screen: NEGATIVE

## 2015-11-19 MED ORDER — PERMETHRIN 5 % EX CREA: 1.0000 "application " | TOPICAL_CREAM | Freq: Once | CUTANEOUS | 0 refills | Status: AC

## 2015-11-19 NOTE — Progress Notes (Signed)
Subjective Chief Complaint  Patient presents with  . Rash    bilateral feet/legs.  onset 4 days ago   . Sore Throat   He notes few day hx/o rash on both feet, L>R.  No sick contacts with similar, no recent visits in hotel or having guests in the house.  No prior similar, no known recent insect bites.   Using nothing on the rash.   They seem to be spreading up legs and are very itchy.  No prior similar.  Has 1 day hx/o mild sore throat, without fever, NVD, no head congestion, no cough, no sick exposure.   No recent acidic foods.  No other aggravating or relieving factors. No other complaint.   Past Medical History:  Diagnosis Date  . Acute meniscal tear of left knee   . Anxiety   . Arthritis   . GERD (gastroesophageal reflux disease)   . Hyperlipidemia   . Hypertension   . Hypogonadism male   . Migraines   . OSA on CPAP    and uses mouthpiece   . Type 2 diabetes mellitus ([place])   . Wears contact lenses     Current Outpatient Prescriptions on File Prior to Visit  Medication Sig Dispense Refill  . omeprazole (PRILOSEC) 40 MG capsule TAKE 1 CAPSULE (40 MG TOTAL) BY MOUTH DAILY. 90 capsule 1  . HYDROcodone-acetaminophen (NORCO) 5-325 MG tablet Take 1 tablet by mouth every 6 (six) hours as needed for moderate pain. (Patient not taking: Reported on 09/10/2015) 30 tablet 0  . Ibuprofen (ADVIL) 200 MG CAPS Take 400 mg by mouth every 4 (four) hours as needed (migraine.). Reported on 09/10/2015    . LORazepam (ATIVAN) 0.5 MG tablet Take 1 tablet (0.5 mg total) by mouth 2 (two) times daily as needed for anxiety. (Patient not taking: Reported on 11/19/2015) 30 tablet 1  . ondansetron (ZOFRAN ODT) 4 MG disintegrating tablet Take 1 tablet (4 mg total) by mouth every 8 (eight) hours as needed for nausea or vomiting. (Patient not taking: Reported on 09/10/2015) 6 tablet 0  . SUMAtriptan (IMITREX) 100 MG tablet Take 1 tablet (100 mg total) by mouth every 2 (two) hours as needed for migraine or headache.  May repeat in 2 hours if headache persists or recurs. (Patient not taking: Reported on 05/20/2015) 10 tablet 0  . tadalafil (CIALIS) 20 MG tablet Take 1 tablet (20 mg total) by mouth daily as needed for erectile dysfunction. (Patient not taking: Reported on 09/10/2015) 2 tablet 0   No current facility-administered medications on file prior to visit.     ROS as in subjective    Objective: BP 120/60   Pulse 72   Temp 98.3 F (36.8 C)   Resp 16   Ht 6' (1.829 m)   Wt 253 lb 12.8 oz (115.1 kg)   SpO2 98%   BMI 34.42 kg/m   General appearance: no distress, WD/WN HEENT: normocephalic, conjunctiva/corneas normal, sclerae anicteric, nares patent, no discharge or erythema, pharynx with mild erythema, no exudate.  Oral cavity: MMM, no lesions  Neck: supple, no lymphadenopathy, no thyromegaly Lungs: CTA bilaterally, no wheezes, rhonchi, or rales Skin: numerous 1-2 mm round erythematous lesions along left foot, ankle, lower leg and some on thigh, none on soles of feet.   Similar lesion  But to much less extent on right foot and lower leg.   Some lesions seems to be crusted and some fresh.     Laboratory Strep test done. Results:negative.  Assessment: Encounter Diagnoses  Name Primary?  . Sore throat Yes  . Scabies   . Rash and nonspecific skin eruption     Plan: Rash, likely scabies.  Discussed treatment with permethrin cream, proper use of medication, washing clothes on hot cycle, and can repeat treatment in a week if needed.  Sore throat - appears viral.   discussed supportive care, recheck if not resolved or improving within 4-5 days.  [name] was seen today for rash and sore throat.  Diagnoses and all orders for this visit:  Sore throat -     Rapid Strep A  Scabies  Rash and nonspecific skin eruption  Other orders -     permethrin (ELIMITE) 5 % cream; Apply 1 application topically once.

## 2015-12-14 ENCOUNTER — Encounter (HOSPITAL_COMMUNITY): Payer: Self-pay | Admitting: Emergency Medicine

## 2015-12-14 ENCOUNTER — Emergency Department (HOSPITAL_COMMUNITY): Payer: 59

## 2015-12-14 ENCOUNTER — Emergency Department (HOSPITAL_COMMUNITY)
Admission: EM | Admit: 2015-12-14 | Discharge: 2015-12-14 | Disposition: A | Discharge status: Home / Self Care | Payer: 59 | Attending: Emergency Medicine | Admitting: Emergency Medicine

## 2015-12-14 ENCOUNTER — Ambulatory Visit (HOSPITAL_COMMUNITY)
Admission: EM | Admit: 2015-12-14 | Discharge: 2015-12-14 | Disposition: A | Discharge status: Nursing Home (Includes ICF) | Payer: 59 | Attending: Family Medicine | Admitting: Family Medicine

## 2015-12-14 DIAGNOSIS — Z87891 Personal history of nicotine dependence: Secondary | ICD-10-CM | POA: Diagnosis not present

## 2015-12-14 DIAGNOSIS — I10 Essential (primary) hypertension: Secondary | ICD-10-CM | POA: Insufficient documentation

## 2015-12-14 DIAGNOSIS — E119 Type 2 diabetes mellitus without complications: Secondary | ICD-10-CM | POA: Diagnosis not present

## 2015-12-14 DIAGNOSIS — Z79899 Other long term (current) drug therapy: Secondary | ICD-10-CM | POA: Diagnosis not present

## 2015-12-14 DIAGNOSIS — R1084 Generalized abdominal pain: Secondary | ICD-10-CM | POA: Diagnosis not present

## 2015-12-14 DIAGNOSIS — R1033 Periumbilical pain: Secondary | ICD-10-CM | POA: Diagnosis not present

## 2015-12-14 LAB — I-STAT CHEM 8, ED
BUN: 22 mg/dL — ABNORMAL HIGH (ref 6–20)
Calcium, Ion: 1.08 mmol/L — ABNORMAL LOW (ref 1.15–1.40)
Chloride: 105 mmol/L (ref 101–111)
Creatinine, Ser: 1.1 mg/dL (ref 0.61–1.24)
Glucose, Bld: 160 mg/dL — ABNORMAL HIGH (ref 65–99)
HCT: 40 % (ref 39.0–52.0)
Hemoglobin: 13.6 g/dL (ref 13.0–17.0)
Potassium: 3.3 mmol/L — ABNORMAL LOW (ref 3.5–5.1)
Sodium: 144 mmol/L (ref 135–145)
TCO2: 24 mmol/L (ref 0–100)

## 2015-12-14 LAB — COMPREHENSIVE METABOLIC PANEL
ALT: 35 U/L (ref 17–63)
AST: 39 U/L (ref 15–41)
Albumin: 4.2 g/dL (ref 3.5–5.0)
Alkaline Phosphatase: 91 U/L (ref 38–126)
Anion gap: 12 (ref 5–15)
BUN: 18 mg/dL (ref 6–20)
CO2: 21 mmol/L — ABNORMAL LOW (ref 22–32)
Calcium: 9.7 mg/dL (ref 8.9–10.3)
Chloride: 99 mmol/L — ABNORMAL LOW (ref 101–111)
Creatinine, Ser: 1.49 mg/dL — ABNORMAL HIGH (ref 0.61–1.24)
GFR calc Af Amer: >60 mL/min (ref >60)
GFR calc non Af Amer: 53 mL/min — ABNORMAL LOW (ref >60)
Glucose, Bld: 212 mg/dL — ABNORMAL HIGH (ref 65–99)
Potassium: 3.6 mmol/L (ref 3.5–5.1)
Sodium: 132 mmol/L — ABNORMAL LOW (ref 135–145)
Total Bilirubin: 1.1 mg/dL (ref 0.3–1.2)
Total Protein: 7 g/dL (ref 6.5–8.1)

## 2015-12-14 LAB — LIPASE, BLOOD: Lipase: 25 U/L (ref 11–51)

## 2015-12-14 LAB — CBC
HCT: 48.3 % (ref 39.0–52.0)
Hemoglobin: 16.4 g/dL (ref 13.0–17.0)
MCH: 30.4 pg (ref 26.0–34.0)
MCHC: 34 g/dL (ref 30.0–36.0)
MCV: 89.4 fL (ref 78.0–100.0)
Platelets: 259 10*3/uL (ref 150–400)
RBC: 5.4 MIL/uL (ref 4.22–5.81)
RDW: 14 % (ref 11.5–15.5)
WBC: 19.1 10*3/uL — ABNORMAL HIGH (ref 4.0–10.5)

## 2015-12-14 LAB — I-STAT TROPONIN, ED: Troponin i, poc: 0 ng/mL (ref 0.00–0.08)

## 2015-12-14 MED ORDER — HYDROMORPHONE HCL 1 MG/ML IJ SOLN
1.0000 mg | Freq: Once | INTRAMUSCULAR | Status: AC
  Administered 2015-12-14: 1 mg via INTRAVENOUS
  Filled 2015-12-14: qty 1

## 2015-12-14 MED ORDER — SODIUM CHLORIDE 0.9 % IV BOLUS (SEPSIS)
1000.0000 mL | Freq: Once | INTRAVENOUS | Status: AC
  Administered 2015-12-14: 1000 mL via INTRAVENOUS

## 2015-12-14 MED ORDER — IOPAMIDOL (ISOVUE-300) INJECTION 61%
INTRAVENOUS | Status: AC
  Administered 2015-12-14: 100 mL
  Filled 2015-12-14: qty 100

## 2015-12-14 MED ORDER — ONDANSETRON HCL 4 MG PO TABS: 4.0000 mg | ORAL_TABLET | Freq: Three times a day (TID) | ORAL | 0 refills | Status: DC | PRN

## 2015-12-14 MED ORDER — HYDROCODONE-ACETAMINOPHEN 5-325 MG PO TABS: 1.0000 | ORAL_TABLET | Freq: Four times a day (QID) | ORAL | 0 refills | Status: AC | PRN

## 2015-12-14 MED ORDER — ONDANSETRON HCL 4 MG/2ML IJ SOLN
4.0000 mg | Freq: Once | INTRAMUSCULAR | Status: AC | PRN
  Administered 2015-12-14: 4 mg via INTRAVENOUS
  Filled 2015-12-14: qty 2

## 2015-12-14 MED ORDER — ONDANSETRON HCL 4 MG/2ML IJ SOLN: 4.0000 mg | Freq: Once | INTRAMUSCULAR | Status: DC

## 2015-12-14 MED ORDER — ONDANSETRON HCL 4 MG/2ML IJ SOLN
4.0000 mg | Freq: Once | INTRAMUSCULAR | Status: AC
  Administered 2015-12-14: 4 mg via INTRAVENOUS

## 2015-12-14 MED ORDER — ONDANSETRON HCL 4 MG/2ML IJ SOLN
INTRAMUSCULAR | Status: AC
  Filled 2015-12-14: qty 2

## 2015-12-14 NOTE — ED Notes (Signed)
Patient left at this time with all belongings. 

## 2015-12-14 NOTE — ED Notes (Signed)
MD at bedside. Verbal order to D/C urine.

## 2015-12-14 NOTE — ED Triage Notes (Signed)
Per [facility]: Pt to ED from [facility] for further evaluation of abdominal pain. Pt c/o sudden onset generalized abd pain with N/V x 2 hours. Sts he ate some pizza, but family had same thing with no symptoms. Pt pale and diaphoretic at [facility] and sent over here. 18g. RAC, given 600cc NS en route. Pt A&O x 4.

## 2015-12-14 NOTE — ED Notes (Signed)
[facility]  Called      Report     Phoned  To  [employer]  [facility]

## 2015-12-14 NOTE — ED Provider Notes (Signed)
[facility] DEPT Provider Note   CSN: [id number] Arrival date & time: 12/14/15  1549     History   Chief Complaint Chief Complaint  Patient presents with  . Abdominal Pain    HPI Gerald Larsen is a 50 y.o. male.   Abdominal Pain   This is a new problem. The current episode started 3 to 5 hours ago. The problem occurs constantly. The problem has not changed since onset.The pain is located in the generalized abdominal region. The quality of the pain is shooting. The pain is severe. Associated symptoms include nausea and vomiting. Pertinent negatives include fever, diarrhea, dysuria, hematuria and arthralgias.    Past Medical History:  Diagnosis Date  . Acute meniscal tear of left knee   . Anxiety   . Arthritis   . GERD (gastroesophageal reflux disease)   . Hyperlipidemia   . Hypertension   . Hypogonadism male   . Migraines   . OSA on CPAP    and uses mouthpiece   . Type 2 diabetes mellitus ([place])   . Wears contact lenses     Patient Active Problem List   Diagnosis Date Noted  . Erectile dysfunction due to diseases classified elsewhere 09/10/2014  . Impingement syndrome of left shoulder 03/12/2014  . Arthritis of left acromioclavicular joint 03/12/2014  . Rotator cuff tear arthropathy 03/12/2014  . Tear of meniscus of left knee 03/24/2013  . Sleep apnea 09/29/2012  . GERD (gastroesophageal reflux disease) 09/29/2012  . Obesity (BMI 30.0-34.9) 09/29/2012  . Glucose intolerance (impaired glucose tolerance) 01/13/2011  . Hyperlipidemia 01/13/2011  . Hypertension 01/13/2011  . Migraine 01/13/2011    Past Surgical History:  Procedure Laterality Date  . CARDIAC CATHETERIZATION  2013   cardiac cath 5/15.   Normal results per patient  . EXAM UNDER ANESTHESIA WITH MANIPULATION OF SHOULDER Left 03/12/2014   Procedure: EXAM UNDER ANESTHESIA WITH MANIPULATION OF SHOULDER;  Surgeon: Johnn Hai, MD;  Location: [facility];  Service: Orthopedics;  Laterality: Left;  .  KNEE ARTHROSCOPY Bilateral 1990's  . KNEE ARTHROSCOPY WITH MEDIAL MENISECTOMY Left 03/24/2013   Procedure: LEFT KNEE ARTHROSCOPY WITH PARTIAL MEDIAL MENISECTOMY, EUA/MUA;  Surgeon: Johnn Hai, MD;  Location: [facility];  Service: Orthopedics;  Laterality: Left;  . LAPAROSCOPIC CHOLECYSTECTOMY  01-17-2004  . [place] SURGERY  2000  . SHOULDER ARTHROSCOPY WITH ROTATOR CUFF REPAIR AND SUBACROMIAL DECOMPRESSION Left 03/12/2014   Procedure: LEFT SHOULDER ARTHROSCOPY WITH DISTAL CLAVICLE RESECTION/SUBACROMIAL DECOMPRESSION/LABRAL DEBRIDEMENT/ROTATOR CUFF DEBRIDEMENT/SUBACROMIAL RESECTION;  Surgeon: Johnn Hai, MD;  Location: [facility];  Service: Orthopedics;  Laterality: Left;       Home Medications    Prior to Admission medications   Medication Sig Start Date End Date Taking? Authorizing Provider  HYDROcodone-acetaminophen (NORCO) 5-325 MG tablet Take 1 tablet by mouth every 6 (six) hours as needed for moderate pain. Patient not taking: Reported on 12/14/2015 06/24/15   Denita Lung, MD  Ibuprofen (ADVIL) 200 MG CAPS Take 400 mg by mouth every 4 (four) hours as needed (migraine.). Reported on 09/10/2015    Historical Provider, MD  lisinopril-hydrochlorothiazide (PRINZIDE,ZESTORETIC) 20-12.5 MG tablet TAKE 2 TABLETS BY MOUTH DAILY. 11/19/15   Camelia Eng Tysinger, PA-C  LORazepam (ATIVAN) 0.5 MG tablet Take 1 tablet (0.5 mg total) by mouth 2 (two) times daily as needed for anxiety. Patient not taking: Reported on 11/19/2015 09/10/15   Denita Lung, MD  metFORMIN (GLUCOPHAGE) 1000 MG tablet Take 1,000 mg by mouth 2 (two) times daily  with a meal.    Historical Provider, MD  omeprazole (PRILOSEC) 40 MG capsule TAKE 1 CAPSULE (40 MG TOTAL) BY MOUTH DAILY. 08/19/15   Denita Lung, MD  ondansetron (ZOFRAN ODT) 4 MG disintegrating tablet Take 1 tablet (4 mg total) by mouth every 8 (eight) hours as needed for nausea or vomiting. Patient not taking: Reported on 09/10/2015 07/24/15   Denita Lung, MD  pravastatin (PRAVACHOL) 40 MG tablet TAKE 1 TABLET (40 MG TOTAL) BY MOUTH DAILY. 11/19/15   Camelia Eng Tysinger, PA-C  SUMAtriptan (IMITREX) 100 MG tablet Take 1 tablet (100 mg total) by mouth every 2 (two) hours as needed for migraine or headache. May repeat in 2 hours if headache persists or recurs. Patient not taking: Reported on 12/14/2015 09/10/14   Denita Lung, MD  tadalafil (CIALIS) 20 MG tablet Take 1 tablet (20 mg total) by mouth daily as needed for erectile dysfunction. Patient not taking: Reported on 12/14/2015 09/10/14   Denita Lung, MD    Family History Family History  Problem Relation Age of Onset  . Arthritis Mother   . Heart disease Mother   . Diabetes Mother   . Depression Mother   . Arthritis Father   . Heart disease Father   . Colon cancer Neg Hx     Social History Social History  Substance Use Topics  . Smoking status: Former Smoker    Quit date: 10/27/1988  . Smokeless tobacco: Never Used  . Alcohol use Yes     Comment: rare     Allergies   Review of patient's allergies indicates no known allergies.   Review of Systems Review of Systems  Constitutional: Negative for chills and fever.  HENT: Negative for ear pain and sore throat.   Eyes: Negative for pain and visual disturbance.  Respiratory: Negative for cough and shortness of breath.   Cardiovascular: Negative for chest pain and palpitations.  Gastrointestinal: Positive for abdominal pain, nausea and vomiting. Negative for diarrhea.  Genitourinary: Negative for dysuria and hematuria.  Musculoskeletal: Negative for arthralgias and back pain.  Skin: Negative for color change and rash.  Neurological: Negative for seizures and syncope.  All other systems reviewed and are negative.    Physical Exam Updated Vital Signs BP 153/98   Pulse 75   Resp 18   SpO2 94%   Physical Exam  Constitutional: He is oriented to person, place, and time. He appears well-developed and well-nourished.    HENT:  Head: Normocephalic and atraumatic.  Eyes: Conjunctivae and EOM are normal. Pupils are equal, round, and reactive to light.  Neck: Normal range of motion. Neck supple.  Cardiovascular: Normal rate and regular rhythm.   Pulmonary/Chest: Effort normal and breath sounds normal. No respiratory distress.  Abdominal: Soft. There is tenderness. There is guarding. There is no rebound.  Musculoskeletal: He exhibits no edema.  Neurological: He is alert and oriented to person, place, and time.  Skin: Skin is warm. He is diaphoretic.  Psychiatric: He has a normal mood and affect.  Nursing note and vitals reviewed.    ED Treatments / Results  Labs (all labs ordered are listed, but only abnormal results are displayed) Labs Reviewed  COMPREHENSIVE METABOLIC PANEL - Abnormal; Notable for the following:       Result Value   Sodium 132 (*)    Chloride 99 (*)    CO2 21 (*)    Glucose, Bld 212 (*)    Creatinine, Ser 1.49 (*)  GFR calc non Af Amer 53 (*)    All other components within normal limits  CBC - Abnormal; Notable for the following:    WBC 19.1 (*)    All other components within normal limits  I-STAT CHEM 8, ED - Abnormal; Notable for the following:    Potassium 3.3 (*)    BUN 22 (*)    Glucose, Bld 160 (*)    Calcium, Ion 1.08 (*)    All other components within normal limits  LIPASE, BLOOD  I-STAT TROPOININ, ED    EKG  EKG Interpretation None       Radiology Ct Abdomen Pelvis W Contrast  Result Date: 12/14/2015 CLINICAL DATA:  Pt states he has a really bad stomach ache mid abd pain and vomiting that started around 1 pm today EXAM: CT ABDOMEN AND PELVIS WITH CONTRAST TECHNIQUE: Multidetector CT imaging of the abdomen and pelvis was performed using the standard protocol following bolus administration of intravenous contrast. CONTRAST:  170mL ISOVUE-300 IOPAMIDOL (ISOVUE-300) INJECTION 61% COMPARISON:  None. FINDINGS: Lower chest: No acute abnormality. Hepatobiliary:  No focal liver abnormality is seen. Status post cholecystectomy. No biliary dilatation. Pancreas: Unremarkable. No pancreatic ductal dilatation or surrounding inflammatory changes. Spleen: Normal in size without focal abnormality. Adrenals/Urinary Tract: Adrenal glands appear normal. Benign parapelvic renal cysts bilaterally, left greater than right. No renal stone or hydronephrosis bilaterally. No ureteral or bladder calculi identified. Bladder is unremarkable, partially decompressed. Stomach/Bowel: Bowel is normal in caliber. There is mild thickening of the walls of the descending and sigmoid colon but difficult to definitively characterize due to inadequate bowel distension. Fluid is present throughout the colon, with associated air-fluid levels. No evidence of small bowel wall inflammation. Stomach appears normal. Appendix is normal. Vascular/Lymphatic: No significant vascular findings are present. No enlarged abdominal or pelvic lymph nodes. Reproductive: Prostate is unremarkable. Other: No free fluid or abscess collection. No free intraperitoneal air. Musculoskeletal: Mild degenerative change throughout the thoracolumbar spine. No acute or suspicious osseous finding. Superficial soft tissues are unremarkable. IMPRESSION: 1. Mild thickening of the walls of the descending and sigmoid colon, but difficult to definitively characterize due to inadequate bowel distension. Suspect mild colitis of infectious or inflammatory nature. Fluid is seen throughout the colon which can be a secondary indication of underlying colitis or gastroenteritis. No bowel obstruction. 2. Additional chronic/incidental findings detailed above. Electronically Signed   By: Franki Cabot M.D.   On: 12/14/2015 19:15    Procedures Procedures (including critical care time)  Medications Ordered in ED Medications  ondansetron (ZOFRAN) injection 4 mg (4 mg Intravenous Given 12/14/15 1622)  HYDROmorphone (DILAUDID) injection 1 mg (1 mg  Intravenous Given 12/14/15 1622)  HYDROmorphone (DILAUDID) injection 1 mg (1 mg Intravenous Given 12/14/15 1730)  sodium chloride 0.9 % bolus 1,000 mL (0 mLs Intravenous Stopped 12/14/15 2020)  iopamidol (ISOVUE-300) 61 % injection (100 mLs  Contrast Given 12/14/15 1832)     Initial Impression / Assessment and Plan / ED Course  I have reviewed the triage vital signs and the nursing notes.  Pertinent labs & imaging results that were available during my care of the patient were reviewed by me and considered in my medical decision making (see chart for details).  Clinical Course    Patient is a 50 year old male with PMH significant for hyperlipidemia, hypertension, and GERD who presents for acute abdominal pain with nausea and vomiting. He is diaphoretic with normal vital signs.  Given IV pain medications for pain control.  Labs  ordered including troponin, lipase, CMP and CBC. Results significant for leukocytosis, AKI, elevated glucose and mild acidosis.  Given NS bolus.  Repeat Chem 8 shows normalization of creatinine.  CT abdomen/pelvis obtained, demonstrates mile colitis, possibly inflammatory vs infectious. No evidence of bowel obstruction, perforation, appendicitis.  Discussed results with patient including need to follow up with Gi; patient expressed he would follow with GI if not improving or if recurrence but would prefer to follow with PCP initially.  Patient is discharged with zofran and pain medication, given strict return precautions, follow up instructions and educational materials.  Final Clinical Impressions(s) / ED Diagnoses   Final diagnoses:  Periumbilical abdominal pain    New Prescriptions Discharge Medication List as of 12/14/2015  8:53 PM    START taking these medications   Details  ondansetron (ZOFRAN) 4 MG tablet Take 1 tablet (4 mg total) by mouth every 8 (eight) hours as needed for nausea or vomiting., Starting Sat 12/14/2015, Print         Elveria Rising, MD 12/15/15 Isabella, MD 12/15/15 2233

## 2015-12-14 NOTE — ED Notes (Signed)
Attempted to obtain urine specimen; Pt unable to provide one at this time 

## 2015-12-14 NOTE — ED Triage Notes (Signed)
Wife brings pt in for abd pain onset 2 hours ago associated w/weakness, nauseas, vomiting, diaphoresis   Wife reports pt fell yest while going up a hill but was not there to witness fall  A&O x4

## 2015-12-14 NOTE — ED Provider Notes (Signed)
[facility]    CSN: [id number] Arrival date & time: 12/14/15  1448     History   Chief Complaint No chief complaint on file.   HPI Gerald Larsen is a 50 y.o. male.   The history is provided by the patient and the spouse.  Emesis  Severity:  Moderate Duration:  2 hours Quality:  Stomach contents Progression:  Unchanged Chronicity:  New Context comment:  Vomited x 2 last eve, fine this am went to party, ate pizza with vomiting again x3. Relieved by:  None tried Worsened by:  Nothing Associated symptoms: abdominal pain   Associated symptoms: no fever   Risk factors: no sick contacts and no suspect food intake     Past Medical History:  Diagnosis Date  . Acute meniscal tear of left knee   . Anxiety   . Arthritis   . GERD (gastroesophageal reflux disease)   . Hyperlipidemia   . Hypertension   . Hypogonadism male   . Migraines   . OSA on CPAP    and uses mouthpiece   . Type 2 diabetes mellitus ([place])   . Wears contact lenses     Patient Active Problem List   Diagnosis Date Noted  . Erectile dysfunction due to diseases classified elsewhere 09/10/2014  . Impingement syndrome of left shoulder 03/12/2014  . Arthritis of left acromioclavicular joint 03/12/2014  . Rotator cuff tear arthropathy 03/12/2014  . Tear of meniscus of left knee 03/24/2013  . Sleep apnea 09/29/2012  . GERD (gastroesophageal reflux disease) 09/29/2012  . Obesity (BMI 30.0-34.9) 09/29/2012  . Glucose intolerance (impaired glucose tolerance) 01/13/2011  . Hyperlipidemia 01/13/2011  . Hypertension 01/13/2011  . Migraine 01/13/2011    Past Surgical History:  Procedure Laterality Date  . CARDIAC CATHETERIZATION  2013   cardiac cath 5/15.   Normal results per patient  . EXAM UNDER ANESTHESIA WITH MANIPULATION OF SHOULDER Left 03/12/2014   Procedure: EXAM UNDER ANESTHESIA WITH MANIPULATION OF SHOULDER;  Surgeon: Johnn Hai, MD;  Location: [facility];  Service: Orthopedics;   Laterality: Left;  . KNEE ARTHROSCOPY Bilateral 1990's  . KNEE ARTHROSCOPY WITH MEDIAL MENISECTOMY Left 03/24/2013   Procedure: LEFT KNEE ARTHROSCOPY WITH PARTIAL MEDIAL MENISECTOMY, EUA/MUA;  Surgeon: Johnn Hai, MD;  Location: [facility];  Service: Orthopedics;  Laterality: Left;  . LAPAROSCOPIC CHOLECYSTECTOMY  01-17-2004  . [place] SURGERY  2000  . SHOULDER ARTHROSCOPY WITH ROTATOR CUFF REPAIR AND SUBACROMIAL DECOMPRESSION Left 03/12/2014   Procedure: LEFT SHOULDER ARTHROSCOPY WITH DISTAL CLAVICLE RESECTION/SUBACROMIAL DECOMPRESSION/LABRAL DEBRIDEMENT/ROTATOR CUFF DEBRIDEMENT/SUBACROMIAL RESECTION;  Surgeon: Johnn Hai, MD;  Location: [facility];  Service: Orthopedics;  Laterality: Left;       Home Medications    Prior to Admission medications   Medication Sig Start Date End Date Taking? Authorizing Provider  HYDROcodone-acetaminophen (NORCO) 5-325 MG tablet Take 1 tablet by mouth every 6 (six) hours as needed for moderate pain. Patient not taking: Reported on 09/10/2015 06/24/15   Denita Lung, MD  Ibuprofen (ADVIL) 200 MG CAPS Take 400 mg by mouth every 4 (four) hours as needed (migraine.). Reported on 09/10/2015    Historical Provider, MD  lisinopril-hydrochlorothiazide (PRINZIDE,ZESTORETIC) 20-12.5 MG tablet TAKE 2 TABLETS BY MOUTH DAILY. 11/19/15   Camelia Eng Tysinger, PA-C  LORazepam (ATIVAN) 0.5 MG tablet Take 1 tablet (0.5 mg total) by mouth 2 (two) times daily as needed for anxiety. Patient not taking: Reported on 11/19/2015 09/10/15   Denita Lung, MD  omeprazole (  PRILOSEC) 40 MG capsule TAKE 1 CAPSULE (40 MG TOTAL) BY MOUTH DAILY. 08/19/15   Denita Lung, MD  ondansetron (ZOFRAN ODT) 4 MG disintegrating tablet Take 1 tablet (4 mg total) by mouth every 8 (eight) hours as needed for nausea or vomiting. Patient not taking: Reported on 09/10/2015 07/24/15   Denita Lung, MD  pravastatin (PRAVACHOL) 40 MG tablet TAKE 1 TABLET (40 MG TOTAL) BY MOUTH DAILY. 11/19/15    Camelia Eng Tysinger, PA-C  SUMAtriptan (IMITREX) 100 MG tablet Take 1 tablet (100 mg total) by mouth every 2 (two) hours as needed for migraine or headache. May repeat in 2 hours if headache persists or recurs. Patient not taking: Reported on 05/20/2015 09/10/14   Denita Lung, MD  tadalafil (CIALIS) 20 MG tablet Take 1 tablet (20 mg total) by mouth daily as needed for erectile dysfunction. Patient not taking: Reported on 09/10/2015 09/10/14   Denita Lung, MD    Family History Family History  Problem Relation Age of Onset  . Arthritis Mother   . Heart disease Mother   . Diabetes Mother   . Depression Mother   . Arthritis Father   . Heart disease Father   . Colon cancer Neg Hx     Social History Social History  Substance Use Topics  . Smoking status: Former Smoker    Quit date: 10/27/1988  . Smokeless tobacco: Never Used  . Alcohol use Yes     Comment: rare     Allergies   Review of patient's allergies indicates no known allergies.   Review of Systems Review of Systems  Constitutional: Negative.  Negative for fever.  Respiratory: Negative.   Cardiovascular: Negative.   Gastrointestinal: Positive for abdominal pain, nausea and vomiting. Negative for blood in stool.  All other systems reviewed and are negative.    Physical Exam Triage Vital Signs ED Triage Vitals  Enc Vitals Group     BP      Pulse      Resp      Temp      Temp src      SpO2      Weight      Height      Head Circumference      Peak Flow      Pain Score      Pain Loc      Pain Edu?      Excl. in [place]?    No data found.   Updated Vital Signs There were no vitals taken for this visit.  Visual Acuity Right Eye Distance:   Left Eye Distance:   Bilateral Distance:    Right Eye Near:   Left Eye Near:    Bilateral Near:     Physical Exam  Constitutional: He is oriented to person, place, and time. He appears well-developed and well-nourished. He appears distressed.  Neck: Normal range of  motion. Neck supple.  Cardiovascular: Normal rate, regular rhythm and normal heart sounds.   Pulmonary/Chest: Effort normal and breath sounds normal.  Abdominal: Soft. Bowel sounds are decreased. There is generalized tenderness. There is guarding. There is no rigidity and no rebound.  Neurological: He is alert and oriented to person, place, and time.  Skin: He is diaphoretic. There is pallor.  Nursing note and vitals reviewed.    UC Treatments / Results  Labs (all labs ordered are listed, but only abnormal results are displayed) Labs Reviewed - No data to display  EKG nonsp st-twave  changes.  Radiology No results found.  Procedures Procedures (including critical care time)  Medications Ordered in UC Medications - No data to display   Initial Impression / Assessment and Plan / UC Course  I have reviewed the triage vital signs and the nursing notes.  Pertinent labs & imaging results that were available during my care of the patient were reviewed by me and considered in my medical decision making (see chart for details).  Clinical Course      Final Clinical Impressions(s) / UC Diagnoses   Final diagnoses:  Generalized abdominal pain    New Prescriptions New Prescriptions   No medications on file     Billy Fischer, MD 12/15/15 1153

## 2015-12-14 NOTE — ED Notes (Signed)
Pt had multiple episodes of diarrhea. He had a semi-solid and large amount of liquid stool. Some mucous and streaks of blood noted. Stool orange in color.

## 2015-12-16 ENCOUNTER — Telehealth: Payer: Self-pay | Admitting: Family Medicine

## 2015-12-16 DIAGNOSIS — K51818 Other ulcerative colitis with other complication: Secondary | ICD-10-CM

## 2015-12-16 NOTE — Telephone Encounter (Signed)
Wife called, pt was at ER this weekend.  Dx Colitis.  Wants referral to GI.  Called Dr. Collene Mares [phone] t/w Lattie Haw, she requested fax.  Referral ok per Dr. Redmond School.

## 2015-12-17 NOTE — Telephone Encounter (Signed)
appt was set up with Dr. Benson Norway for yesterday at 3:00 pt aware.  Later on same day wife called and said pt was going to cancel as he could not stay out of the bathroom.

## 2016-02-03 ENCOUNTER — Other Ambulatory Visit: Payer: Self-pay | Admitting: Family Medicine

## 2016-02-03 MED ORDER — OMEPRAZOLE 40 MG PO CPDR: DELAYED_RELEASE_CAPSULE | ORAL | 1 refills | Status: DC

## 2016-02-19 ENCOUNTER — Telehealth: Payer: Self-pay | Admitting: Family Medicine

## 2016-02-19 ENCOUNTER — Other Ambulatory Visit: Payer: Self-pay | Admitting: Medical

## 2016-02-19 MED ORDER — HYDROCODONE-ACETAMINOPHEN 5-325 MG PO TABS: 1.0000 | ORAL_TABLET | Freq: Four times a day (QID) | ORAL | 0 refills | Status: DC | PRN

## 2016-02-19 NOTE — Telephone Encounter (Signed)
Pt called and needs refill for Norco for Migraines.  Please call pt when ready [phone] Pt has cpe 12/21

## 2016-03-05 ENCOUNTER — Encounter: Payer: Self-pay | Admitting: Family Medicine

## 2016-03-05 ENCOUNTER — Other Ambulatory Visit: Payer: Self-pay

## 2016-03-05 ENCOUNTER — Telehealth: Payer: Self-pay | Admitting: Family Medicine

## 2016-03-05 ENCOUNTER — Ambulatory Visit (INDEPENDENT_AMBULATORY_CARE_PROVIDER_SITE_OTHER): Payer: 59 | Admitting: Family Medicine

## 2016-03-05 VITALS — BP 130/100 | HR 80 | Ht 72.0 in | Wt 269.8 lb

## 2016-03-05 DIAGNOSIS — I10 Essential (primary) hypertension: Secondary | ICD-10-CM | POA: Diagnosis not present

## 2016-03-05 DIAGNOSIS — G4730 Sleep apnea, unspecified: Secondary | ICD-10-CM | POA: Diagnosis not present

## 2016-03-05 DIAGNOSIS — G43109 Migraine with aura, not intractable, without status migrainosus: Secondary | ICD-10-CM

## 2016-03-05 DIAGNOSIS — K219 Gastro-esophageal reflux disease without esophagitis: Secondary | ICD-10-CM

## 2016-03-05 DIAGNOSIS — R7302 Impaired glucose tolerance (oral): Secondary | ICD-10-CM | POA: Diagnosis not present

## 2016-03-05 DIAGNOSIS — N521 Erectile dysfunction due to diseases classified elsewhere: Secondary | ICD-10-CM | POA: Diagnosis not present

## 2016-03-05 DIAGNOSIS — Z0000 Encounter for general adult medical examination without abnormal findings: Secondary | ICD-10-CM | POA: Diagnosis not present

## 2016-03-05 DIAGNOSIS — E785 Hyperlipidemia, unspecified: Secondary | ICD-10-CM

## 2016-03-05 DIAGNOSIS — E669 Obesity, unspecified: Secondary | ICD-10-CM

## 2016-03-05 LAB — POCT URINALYSIS DIPSTICK
Bilirubin, UA: NEGATIVE
Blood, UA: NEGATIVE
Glucose, UA: NEGATIVE
Ketones, UA: NEGATIVE
Leukocytes, UA: NEGATIVE
Nitrite, UA: NEGATIVE
Protein, UA: NEGATIVE
Spec Grav, UA: 1.015
Urobilinogen, UA: NEGATIVE
pH, UA: 8

## 2016-03-05 LAB — POCT GLYCOSYLATED HEMOGLOBIN (HGB A1C): Hemoglobin A1C: 5.8

## 2016-03-05 MED ORDER — OMEPRAZOLE 40 MG PO CPDR: DELAYED_RELEASE_CAPSULE | ORAL | 3 refills | Status: DC

## 2016-03-05 MED ORDER — SILDENAFIL CITRATE 100 MG PO TABS: 100.0000 mg | ORAL_TABLET | Freq: Every day | ORAL | 5 refills | Status: DC | PRN

## 2016-03-05 MED ORDER — LISINOPRIL-HYDROCHLOROTHIAZIDE 20-12.5 MG PO TABS: 2.0000 | ORAL_TABLET | Freq: Every day | ORAL | 3 refills | Status: DC

## 2016-03-05 MED ORDER — PRAVASTATIN SODIUM 40 MG PO TABS: 40.0000 mg | ORAL_TABLET | Freq: Every day | ORAL | 0 refills | Status: DC

## 2016-03-05 NOTE — Progress Notes (Signed)
Subjective:    Patient ID: Gerald Larsen, male    DOB: April 20, 1965, 50 y.o.   MRN: [id number]  HPI He is here for complete examination. He has gained a significant amount of weight over the last several months. He does have a previous history of glucose intolerance. He does admit to overindulgence. He has been out of work for a [employer]. related hand injury and is still having difficulty with full motion of this. As an underlying history of OSA but is not using CPAP. He says he cannot get used to using any appliances on his face. He also has underlying ED and has tried Cialis without much success. He also has to history of migraine headaches and usually gets 2 per week. He has tried various triptan's with no good success. Presently he is using codeine with fairly good results. His reflux is under good control. He does have arthritis and is seeing orthopedics for various orthopedic related issues. He continues on his blood pressure medications. Smoking and drinking are reviewed. Family and social history as well as health maintenance and immunizations was reviewed. He has no other concerns or complaints.   Review of Systems  All other systems reviewed and are negative.      Objective:   Physical Exam BP (!) 130/100   Pulse 80   Ht 6' (1.829 m)   Wt 269 lb 12.8 oz (122.4 kg)   BMI 36.59 kg/m   General Appearance:    Alert, cooperative, no distress, appears stated age  Head:    Normocephalic, without obvious abnormality, atraumatic  Eyes:    PERRL, conjunctiva/corneas clear, EOM's intact, fundi    benign  Ears:    Normal TM's and external ear canals  Nose:   Nares normal, mucosa normal, no drainage or sinus   tenderness  Throat:   Lips, mucosa, and tongue normal; teeth and gums normal  Neck:   Supple, no lymphadenopathy;  thyroid:  no   enlargement/tenderness/nodules; no carotid   bruit or JVD     Lungs:     Clear to auscultation bilaterally without wheezes, rales or      ronchi; respirations unlabored  Chest Wall:    No tenderness or deformity   Heart:    Regular rate and rhythm, S1 and S2 normal, no murmur, rub   or gallop     Abdomen:     Soft, non-tender, nondistended, normoactive bowel sounds,    no masses, no hepatosplenomegaly  Genitalia:    Normal male external genitalia without lesions.  Testicles without masses.  No inguinal hernias.     Extremities:   No clubbing, cyanosis or edema  Pulses:   2+ and symmetric all extremities  Skin:   Skin color, texture, turgor normal, no rashes or lesions  Lymph nodes:   Cervical, supraclavicular, and axillary nodes normal  Neurologic:   CNII-XII intact, normal strength, sensation and gait; reflexes 2+ and symmetric throughout          Psych:   Normal mood, affect, hygiene and grooming.      A1c is 5.8      Assessment & Plan:  Glucose intolerance (impaired glucose tolerance) - Plan: POCT Urinalysis Dipstick, HgB A1c  Routine general medical examination at a health care facility  Essential hypertension  Migraine with aura and without status migrainosus, not intractable  Sleep apnea, unspecified type  Obesity (BMI 30.0-34.9)  Hyperlipidemia, unspecified hyperlipidemia type - Plan: pravastatin (PRAVACHOL) 40 MG tablet  Gastroesophageal reflux  disease without esophagitis  Erectile dysfunction due to diseases classified elsewhere Did encourage him to be make changes in diet and exercise especially diet. Recheck his blood pressure in approximately one month. I will also refer him for a research study on migraine and see if he would qualify. Will not pursue the sleep apnea at this point. Continue on his reflux medications.

## 2016-03-05 NOTE — Patient Instructions (Signed)
Look at your carbohydrates and cut back on them. The best way to remember that is cut back on white food

## 2016-03-05 NOTE — Telephone Encounter (Signed)
Please send in refills for his Pravachol, Prilosec and Lisinopril to [facility]

## 2016-03-05 NOTE — Progress Notes (Signed)
   Subjective:    Patient ID: Gerald Larsen, male    DOB: February 10, 1966, 50 y.o.   MRN: [id number]  HPI    Review of Systems     Objective:   Physical Exam        Assessment & Plan:

## 2016-03-05 NOTE — Telephone Encounter (Signed)
Sent in lisinopril and prilosec have to wait on labs for pravachol

## 2016-05-31 ENCOUNTER — Other Ambulatory Visit: Payer: Self-pay | Admitting: Family Medicine

## 2016-05-31 DIAGNOSIS — E785 Hyperlipidemia, unspecified: Secondary | ICD-10-CM

## 2016-06-01 NOTE — Telephone Encounter (Signed)
Needs an appt. Don't want him run out

## 2016-06-01 NOTE — Telephone Encounter (Signed)
Pt was seen here 12/17 for CPE, however has not returned for labs. Please advise what labs you would like entered so I can call pt to schedule this appt to refill pravastatin. Victor[place] December

## 2016-06-02 NOTE — Telephone Encounter (Signed)
Left message for pt he needs an appointment to see Dr.Lalonde and have labs done

## 2016-06-16 ENCOUNTER — Other Ambulatory Visit: Payer: Self-pay | Admitting: Family Medicine

## 2016-06-16 DIAGNOSIS — E785 Hyperlipidemia, unspecified: Secondary | ICD-10-CM

## 2016-08-24 ENCOUNTER — Other Ambulatory Visit: Payer: Self-pay | Admitting: Family Medicine

## 2016-09-14 ENCOUNTER — Telehealth: Payer: Self-pay | Admitting: Family Medicine

## 2016-09-14 NOTE — Telephone Encounter (Signed)
Make a referral without seeing him.

## 2016-09-14 NOTE — Telephone Encounter (Signed)
Pt wife emailed and said pt helped move his daughter and her husband back to [place] all weekend from [place].  His shoulder is killing him and would like an injection by Dr. Maxie Better at [facility].  He wants to know if you refer him, is it possible that he would be seen sooner?

## 2016-09-14 NOTE — Telephone Encounter (Signed)
Pt informed word for word no referral could be made unless you see him and a referral want get him a shot no faster pt verbalized understanding

## 2016-09-14 NOTE — Telephone Encounter (Signed)
The first thing that they're gone 1 do is to make sure that he has been taking either Advil or Aleve for pain relief before they would give him a shot. Let him know that we will not be able to get him in and I then he could do on his own

## 2016-09-14 NOTE — Telephone Encounter (Signed)
Dr.Lalonde I do not understand the message

## 2016-09-15 ENCOUNTER — Ambulatory Visit (INDEPENDENT_AMBULATORY_CARE_PROVIDER_SITE_OTHER): Payer: BLUE CROSS/BLUE SHIELD | Admitting: Family Medicine

## 2016-09-15 ENCOUNTER — Encounter: Payer: Self-pay | Admitting: Family Medicine

## 2016-09-15 VITALS — BP 130/84 | HR 92 | Wt 241.0 lb

## 2016-09-15 DIAGNOSIS — G8929 Other chronic pain: Secondary | ICD-10-CM | POA: Diagnosis not present

## 2016-09-15 DIAGNOSIS — M25511 Pain in right shoulder: Secondary | ICD-10-CM | POA: Diagnosis not present

## 2016-09-15 MED ORDER — LIDOCAINE HCL 2 % IJ SOLN
3.0000 mL | Freq: Once | INTRAMUSCULAR | Status: AC
  Administered 2016-09-15: 60 mg via INTRADERMAL

## 2016-09-15 MED ORDER — TRIAMCINOLONE ACETONIDE 40 MG/ML IJ SUSP
40.0000 mg | Freq: Once | INTRAMUSCULAR | Status: AC
  Administered 2016-09-15: 40 mg via INTRAMUSCULAR

## 2016-09-15 NOTE — Progress Notes (Signed)
   Subjective:    Patient ID: Gerald Larsen, male    DOB: [birth year], 51 y.o.   MRN: [id number]  HPI He is here for consult concerning right shoulder pain. He states that he has had difficulty with this for approximately 2 years. He notes a special difficulty with abduction and external rotation. He states the pain is very similar to pain he had in his left shoulder that required rotator cuff surgery. He apparently was lifting something 2 days ago and felt pain in the right shoulder causing him to drop the object. He has tried Tylenol as well as Advil 800 3 times a day without success. He is scheduled to see Dr. Maxie Better in approximately 2 weeks. He did the surgery on his left shoulder.  Review of Systems     Objective:   Physical Exam Alert and in no distress. Unable to fully abduct his shoulder. No tenderness palpation over the before meals joint or bicipital groove. Drop arm test negative. Supraspinatus testing negative. No sulcus sign. Neer's and Hawkins test was uncomfortable.       Assessment & Plan:  Chronic right shoulder pain - Plan: triamcinolone acetonide (KENALOG-40) injection 40 mg, lidocaine (XYLOCAINE) 2 % (with pres) injection 60 mg, DG Shoulder Right I explained that his history is not necessarily indicative of rotator cuff issue. He could also be having a true shoulder joint issue.he is aware of this and would like an injection.  The right shoulder was prepped in the posterior lateral area. 40 mg of Kenalog and 3 mL of Xylocaine was injected into the subacromial bursa without difficulty. He did note some initial slight relief of his symptoms. He will follow-up with Dr. Maxie Better.

## 2016-09-19 ENCOUNTER — Other Ambulatory Visit: Payer: Self-pay | Admitting: Medical

## 2016-09-19 DIAGNOSIS — E785 Hyperlipidemia, unspecified: Secondary | ICD-10-CM

## 2016-09-21 NOTE — Telephone Encounter (Signed)
L/m for pt call us back to make an appt, for refill.

## 2016-09-30 ENCOUNTER — Telehealth: Payer: Self-pay | Admitting: Family Medicine

## 2016-09-30 DIAGNOSIS — E785 Hyperlipidemia, unspecified: Secondary | ICD-10-CM

## 2016-09-30 MED ORDER — PRAVASTATIN SODIUM 40 MG PO TABS: 40.0000 mg | ORAL_TABLET | Freq: Every day | ORAL | 0 refills | Status: DC

## 2016-09-30 MED ORDER — HYDROCODONE-ACETAMINOPHEN 5-325 MG PO TABS: 1.0000 | ORAL_TABLET | Freq: Four times a day (QID) | ORAL | 0 refills | Status: DC | PRN

## 2016-09-30 NOTE — Telephone Encounter (Signed)
Pt called requesting a refill on his pravastatin he made a medcheck appt for august the 1st.   Pt called requesting a refill on his Norco states he is having headaches, and states this helps him a lot  [id number] [place], [place] - [address] Pt can be reached at [phone]

## 2016-10-14 ENCOUNTER — Encounter: Payer: Self-pay | Admitting: Family Medicine

## 2016-10-14 ENCOUNTER — Ambulatory Visit (INDEPENDENT_AMBULATORY_CARE_PROVIDER_SITE_OTHER): Payer: BLUE CROSS/BLUE SHIELD | Admitting: Family Medicine

## 2016-10-14 VITALS — BP 138/88 | HR 63 | Wt 250.6 lb

## 2016-10-14 DIAGNOSIS — G4730 Sleep apnea, unspecified: Secondary | ICD-10-CM | POA: Diagnosis not present

## 2016-10-14 DIAGNOSIS — I10 Essential (primary) hypertension: Secondary | ICD-10-CM

## 2016-10-14 DIAGNOSIS — G43109 Migraine with aura, not intractable, without status migrainosus: Secondary | ICD-10-CM | POA: Diagnosis not present

## 2016-10-14 DIAGNOSIS — E669 Obesity, unspecified: Secondary | ICD-10-CM

## 2016-10-14 DIAGNOSIS — N521 Erectile dysfunction due to diseases classified elsewhere: Secondary | ICD-10-CM

## 2016-10-14 DIAGNOSIS — E785 Hyperlipidemia, unspecified: Secondary | ICD-10-CM | POA: Diagnosis not present

## 2016-10-14 DIAGNOSIS — K219 Gastro-esophageal reflux disease without esophagitis: Secondary | ICD-10-CM

## 2016-10-14 DIAGNOSIS — R7302 Impaired glucose tolerance (oral): Secondary | ICD-10-CM | POA: Diagnosis not present

## 2016-10-14 LAB — CBC WITH DIFFERENTIAL/PLATELET
Basophils Absolute: 46 cells/uL (ref 0–200)
Basophils Relative: 1 %
Eosinophils Absolute: 92 cells/uL (ref 15–500)
Eosinophils Relative: 2 %
HCT: 43 % (ref 38.5–50.0)
Hemoglobin: 14.1 g/dL (ref 13.2–17.1)
Lymphocytes Relative: 31 %
Lymphs Abs: 1426 cells/uL (ref 850–3900)
MCH: 30 pg (ref 27.0–33.0)
MCHC: 32.8 g/dL (ref 32.0–36.0)
MCV: 91.5 fL (ref 80.0–100.0)
MPV: 9.3 fL (ref 7.5–12.5)
Monocytes Absolute: 276 cells/uL (ref 200–950)
Monocytes Relative: 6 %
Neutro Abs: 2760 cells/uL (ref 1500–7800)
Neutrophils Relative %: 60 %
Platelets: 200 10*3/uL (ref 140–400)
RBC: 4.7 MIL/uL (ref 4.20–5.80)
RDW: 14.4 % (ref 11.0–15.0)
WBC: 4.6 10*3/uL (ref 4.0–10.5)

## 2016-10-14 MED ORDER — LISINOPRIL-HYDROCHLOROTHIAZIDE 20-12.5 MG PO TABS: 2.0000 | ORAL_TABLET | Freq: Every day | ORAL | 3 refills | Status: DC

## 2016-10-14 MED ORDER — PRAVASTATIN SODIUM 40 MG PO TABS: 40.0000 mg | ORAL_TABLET | Freq: Every day | ORAL | 3 refills | Status: DC

## 2016-10-14 MED ORDER — OMEPRAZOLE 40 MG PO CPDR: DELAYED_RELEASE_CAPSULE | ORAL | 3 refills | Status: DC

## 2016-10-14 MED ORDER — AVANAFIL 200 MG PO TABS: 1.0000 | ORAL_TABLET | ORAL | 5 refills | Status: DC | PRN

## 2016-10-14 NOTE — Progress Notes (Signed)
   Subjective:    Patient ID: Gerald Larsen, male    DOB: 09-18-1965, 51 y.o.   MRN: [id number]  HPI He is here for an interval evaluation. He has lost about 20 pounds as much due to his wife changing eating habits and cooking. He also has underlying OSA and is using an oral appliance. He is not interested in having this further evaluated with another sleep study. Continues on Pravachol for treatment of his underlying hyperlipidemia. He does have a history of glucose intolerance. He does have migraine headaches and these seem to be under adequate control with present medication regimen. His reflux is controlled and requires using the Prilosec on a regular basis. He does have ED and has tried Viagra as well as possibly Cialis without much benefit.   Review of Systems     Objective:   Physical Exam Alert and in no distress. Tympanic membranes and canals are normal. Pharyngeal area is normal. Neck is supple without adenopathy or thyromegaly. Cardiac exam shows a regular sinus rhythm without murmurs or gallops. Lungs are clear to auscultation.        Assessment & Plan:  Essential hypertension - Plan: CBC with Differential/Platelet, Comprehensive metabolic panel, lisinopril-hydrochlorothiazide (PRINZIDE,ZESTORETIC) 20-12.5 MG tablet  Hyperlipidemia, unspecified hyperlipidemia type - Plan: pravastatin (PRAVACHOL) 40 MG tablet  Glucose intolerance (impaired glucose tolerance) - Plan: CBC with Differential/Platelet, Comprehensive metabolic panel, Lipid panel  Migraine with aura and without status migrainosus, not intractable  Erectile dysfunction due to diseases classified elsewhere - Plan: Avanafil (STENDRA) 200 MG TABS  Gastroesophageal reflux disease without esophagitis  Obesity (BMI 30.0-34.9) - Plan: CBC with Differential/Platelet, Comprehensive metabolic panel, Lipid panel  Sleep apnea, unspecified type He will continue on his present medication regimen. I will also have him try  [facility] and he will let me know how this works. Encouraged him to continue with his weight loss regimen stating that it should help with his blood pressure, OSA, hyperlipidemia and glucose intolerance.

## 2016-10-15 LAB — LIPID PANEL
Cholesterol: 152 mg/dL (ref <200)
HDL: 64 mg/dL (ref >40)
LDL Cholesterol: 75 mg/dL (ref <100)
Total CHOL/HDL Ratio: 2.4 Ratio (ref <5.0)
Triglycerides: 63 mg/dL (ref <150)
VLDL: 13 mg/dL (ref <30)

## 2016-10-15 LAB — COMPREHENSIVE METABOLIC PANEL
ALT: 29 U/L (ref 9–46)
AST: 26 U/L (ref 10–35)
Albumin: 4 g/dL (ref 3.6–5.1)
Alkaline Phosphatase: 50 U/L (ref 40–115)
BUN: 17 mg/dL (ref 7–25)
CO2: 22 mmol/L (ref 20–31)
Calcium: 9 mg/dL (ref 8.6–10.3)
Chloride: 106 mmol/L (ref 98–110)
Creat: 0.97 mg/dL (ref 0.70–1.33)
Glucose, Bld: 91 mg/dL (ref 65–99)
Potassium: 4.3 mmol/L (ref 3.5–5.3)
Sodium: 141 mmol/L (ref 135–146)
Total Bilirubin: 0.5 mg/dL (ref 0.2–1.2)
Total Protein: 6.2 g/dL (ref 6.1–8.1)

## 2017-01-12 DIAGNOSIS — G8929 Other chronic pain: Secondary | ICD-10-CM | POA: Diagnosis not present

## 2017-01-12 DIAGNOSIS — M1711 Unilateral primary osteoarthritis, right knee: Secondary | ICD-10-CM | POA: Diagnosis not present

## 2017-01-12 DIAGNOSIS — M25512 Pain in left shoulder: Secondary | ICD-10-CM | POA: Diagnosis not present

## 2017-01-19 ENCOUNTER — Other Ambulatory Visit: Payer: Self-pay | Admitting: Specialist

## 2017-01-19 DIAGNOSIS — M25512 Pain in left shoulder: Principal | ICD-10-CM

## 2017-01-19 DIAGNOSIS — G8929 Other chronic pain: Secondary | ICD-10-CM

## 2017-02-12 ENCOUNTER — Ambulatory Visit
Admission: RE | Admit: 2017-02-12 | Discharge: 2017-02-12 | Disposition: A | Discharge status: Home / Self Care | Payer: BLUE CROSS/BLUE SHIELD | Source: Ambulatory Visit | Attending: Specialist | Admitting: Specialist

## 2017-02-12 DIAGNOSIS — S46012A Strain of muscle(s) and tendon(s) of the rotator cuff of left shoulder, initial encounter: Secondary | ICD-10-CM | POA: Diagnosis not present

## 2017-02-12 DIAGNOSIS — G8929 Other chronic pain: Secondary | ICD-10-CM

## 2017-02-12 DIAGNOSIS — M25512 Pain in left shoulder: Principal | ICD-10-CM

## 2017-02-19 DIAGNOSIS — G8929 Other chronic pain: Secondary | ICD-10-CM | POA: Diagnosis not present

## 2017-02-19 DIAGNOSIS — M25512 Pain in left shoulder: Secondary | ICD-10-CM | POA: Diagnosis not present

## 2017-06-30 ENCOUNTER — Other Ambulatory Visit: Payer: Self-pay | Admitting: Family Medicine

## 2017-06-30 DIAGNOSIS — E785 Hyperlipidemia, unspecified: Secondary | ICD-10-CM

## 2017-09-27 ENCOUNTER — Other Ambulatory Visit: Payer: Self-pay | Admitting: Family Medicine

## 2017-09-27 DIAGNOSIS — E785 Hyperlipidemia, unspecified: Secondary | ICD-10-CM

## 2017-09-30 DIAGNOSIS — M7542 Impingement syndrome of left shoulder: Secondary | ICD-10-CM | POA: Diagnosis not present

## 2017-09-30 DIAGNOSIS — M7541 Impingement syndrome of right shoulder: Secondary | ICD-10-CM | POA: Diagnosis not present

## 2017-10-07 DIAGNOSIS — M7541 Impingement syndrome of right shoulder: Secondary | ICD-10-CM | POA: Diagnosis not present

## 2017-10-20 ENCOUNTER — Other Ambulatory Visit: Payer: Self-pay | Admitting: Family Medicine

## 2017-10-20 DIAGNOSIS — M67929 Unspecified disorder of synovium and tendon, unspecified upper arm: Secondary | ICD-10-CM | POA: Diagnosis not present

## 2017-10-20 DIAGNOSIS — M75101 Unspecified rotator cuff tear or rupture of right shoulder, not specified as traumatic: Secondary | ICD-10-CM | POA: Diagnosis not present

## 2017-10-20 DIAGNOSIS — M13811 Other specified arthritis, right shoulder: Secondary | ICD-10-CM | POA: Diagnosis not present

## 2017-10-20 DIAGNOSIS — M7541 Impingement syndrome of right shoulder: Secondary | ICD-10-CM | POA: Diagnosis not present

## 2017-10-21 ENCOUNTER — Telehealth: Payer: Self-pay

## 2017-10-21 ENCOUNTER — Telehealth: Payer: Self-pay | Admitting: Family Medicine

## 2017-10-21 NOTE — Telephone Encounter (Signed)
Pt dropped off surgical clearance form to be filled out put in your folder pt can be reached at [phone]

## 2017-10-21 NOTE — Telephone Encounter (Signed)
Spoke to pt he is dealing with death in the family and will call later to make a med check or cpe when he has a chance. Med will be sent in for 30 days . [place]

## 2017-10-27 ENCOUNTER — Encounter: Payer: Self-pay | Admitting: Family Medicine

## 2017-10-27 ENCOUNTER — Ambulatory Visit: Payer: BLUE CROSS/BLUE SHIELD | Admitting: Family Medicine

## 2017-10-27 VITALS — BP 120/76 | HR 96 | Temp 98.2°F | Wt 253.6 lb

## 2017-10-27 DIAGNOSIS — R7302 Impaired glucose tolerance (oral): Secondary | ICD-10-CM

## 2017-10-27 DIAGNOSIS — Z01818 Encounter for other preprocedural examination: Secondary | ICD-10-CM

## 2017-10-27 DIAGNOSIS — K219 Gastro-esophageal reflux disease without esophagitis: Secondary | ICD-10-CM

## 2017-10-27 DIAGNOSIS — I10 Essential (primary) hypertension: Secondary | ICD-10-CM

## 2017-10-27 DIAGNOSIS — E785 Hyperlipidemia, unspecified: Secondary | ICD-10-CM

## 2017-10-27 NOTE — Progress Notes (Signed)
   Subjective:    Patient ID: Gerald Larsen, male    DOB: 30-Oct-1965, 52 y.o.   MRN: [id number]  HPI He is here for preoperative evaluation.  He is getting ready to have shoulder surgery.  He has no particular concerns or complaints.  No recent or past history of cardiac or pulmonary issues.  No chest pain, shortness of breath, PND or orthopnea.  He does have a previous history of reflux disease as well as glucose intolerance, hypertension and hyperlipidemia.  There is also history of sleep apnea.   Review of Systems     Objective:   Physical Exam Alert and in no distress. Tympanic membranes and canals are normal. Pharyngeal area is normal. Neck is supple without adenopathy or thyromegaly. Cardiac exam shows a regular sinus rhythm without murmurs or gallops. Lungs are clear to auscultation.       Assessment & Plan:  Preoperative examination  Gastroesophageal reflux disease without esophagitis  Glucose intolerance (impaired glucose tolerance)  Hyperlipidemia, unspecified hyperlipidemia type  Essential hypertension He is cleared for shoulder surgery.

## 2017-10-28 ENCOUNTER — Telehealth: Payer: Self-pay

## 2017-10-28 NOTE — Telephone Encounter (Signed)
Pt was called to advise that surgical clearance form has been sent back to [facility]. [place]

## 2017-11-16 ENCOUNTER — Other Ambulatory Visit: Payer: Self-pay | Admitting: Family Medicine

## 2017-11-16 NOTE — Telephone Encounter (Signed)
Pt was called to advise that he needs to make an appt. [place]

## 2017-11-29 DIAGNOSIS — M24111 Other articular cartilage disorders, right shoulder: Secondary | ICD-10-CM | POA: Diagnosis not present

## 2017-11-29 DIAGNOSIS — S43431A Superior glenoid labrum lesion of right shoulder, initial encounter: Secondary | ICD-10-CM | POA: Diagnosis not present

## 2017-11-29 DIAGNOSIS — S43432A Superior glenoid labrum lesion of left shoulder, initial encounter: Secondary | ICD-10-CM | POA: Diagnosis not present

## 2017-11-29 DIAGNOSIS — G8918 Other acute postprocedural pain: Secondary | ICD-10-CM | POA: Diagnosis not present

## 2017-11-29 DIAGNOSIS — M7541 Impingement syndrome of right shoulder: Secondary | ICD-10-CM | POA: Diagnosis not present

## 2017-11-29 DIAGNOSIS — M19011 Primary osteoarthritis, right shoulder: Secondary | ICD-10-CM | POA: Diagnosis not present

## 2017-11-29 DIAGNOSIS — M75111 Incomplete rotator cuff tear or rupture of right shoulder, not specified as traumatic: Secondary | ICD-10-CM | POA: Diagnosis not present

## 2017-11-29 DIAGNOSIS — M7521 Bicipital tendinitis, right shoulder: Secondary | ICD-10-CM | POA: Diagnosis not present

## 2017-12-10 ENCOUNTER — Other Ambulatory Visit: Payer: Self-pay | Admitting: Family Medicine

## 2017-12-15 ENCOUNTER — Other Ambulatory Visit: Payer: Self-pay | Admitting: Family Medicine

## 2017-12-15 DIAGNOSIS — I10 Essential (primary) hypertension: Secondary | ICD-10-CM

## 2017-12-23 ENCOUNTER — Other Ambulatory Visit: Payer: Self-pay | Admitting: Family Medicine

## 2017-12-23 DIAGNOSIS — E785 Hyperlipidemia, unspecified: Secondary | ICD-10-CM

## 2017-12-24 DIAGNOSIS — M25511 Pain in right shoulder: Secondary | ICD-10-CM | POA: Diagnosis not present

## 2018-01-05 DIAGNOSIS — M25511 Pain in right shoulder: Secondary | ICD-10-CM | POA: Diagnosis not present

## 2018-01-12 DIAGNOSIS — M75101 Unspecified rotator cuff tear or rupture of right shoulder, not specified as traumatic: Secondary | ICD-10-CM | POA: Diagnosis not present

## 2018-01-19 DIAGNOSIS — M25519 Pain in unspecified shoulder: Secondary | ICD-10-CM | POA: Diagnosis not present

## 2018-01-29 ENCOUNTER — Other Ambulatory Visit: Payer: Self-pay | Admitting: Family Medicine

## 2018-02-03 DIAGNOSIS — M25511 Pain in right shoulder: Secondary | ICD-10-CM | POA: Diagnosis not present

## 2018-02-08 DIAGNOSIS — M25511 Pain in right shoulder: Secondary | ICD-10-CM | POA: Diagnosis not present

## 2018-02-16 ENCOUNTER — Encounter: Payer: BLUE CROSS/BLUE SHIELD | Admitting: Family Medicine

## 2018-02-16 DIAGNOSIS — M25511 Pain in right shoulder: Secondary | ICD-10-CM | POA: Diagnosis not present

## 2018-02-21 DIAGNOSIS — M25561 Pain in right knee: Secondary | ICD-10-CM | POA: Diagnosis not present

## 2018-02-22 DIAGNOSIS — M25519 Pain in unspecified shoulder: Secondary | ICD-10-CM | POA: Diagnosis not present

## 2018-03-03 ENCOUNTER — Other Ambulatory Visit: Payer: Self-pay | Admitting: Family Medicine

## 2018-03-25 DIAGNOSIS — M75101 Unspecified rotator cuff tear or rupture of right shoulder, not specified as traumatic: Secondary | ICD-10-CM | POA: Diagnosis not present

## 2018-03-25 DIAGNOSIS — M25511 Pain in right shoulder: Secondary | ICD-10-CM | POA: Diagnosis not present

## 2018-03-27 ENCOUNTER — Other Ambulatory Visit: Payer: Self-pay | Admitting: Family Medicine

## 2018-04-05 ENCOUNTER — Encounter: Payer: Self-pay | Admitting: Family Medicine

## 2018-04-05 ENCOUNTER — Ambulatory Visit: Payer: BLUE CROSS/BLUE SHIELD | Admitting: Family Medicine

## 2018-04-05 VITALS — BP 130/88 | HR 79 | Temp 98.1°F | Ht 71.0 in | Wt 271.0 lb

## 2018-04-05 DIAGNOSIS — Z23 Encounter for immunization: Secondary | ICD-10-CM

## 2018-04-05 DIAGNOSIS — K219 Gastro-esophageal reflux disease without esophagitis: Secondary | ICD-10-CM

## 2018-04-05 DIAGNOSIS — Z87891 Personal history of nicotine dependence: Secondary | ICD-10-CM | POA: Insufficient documentation

## 2018-04-05 DIAGNOSIS — Z0000 Encounter for general adult medical examination without abnormal findings: Secondary | ICD-10-CM | POA: Diagnosis not present

## 2018-04-05 DIAGNOSIS — E785 Hyperlipidemia, unspecified: Secondary | ICD-10-CM | POA: Diagnosis not present

## 2018-04-05 DIAGNOSIS — E291 Testicular hypofunction: Secondary | ICD-10-CM | POA: Insufficient documentation

## 2018-04-05 DIAGNOSIS — G4730 Sleep apnea, unspecified: Secondary | ICD-10-CM

## 2018-04-05 DIAGNOSIS — E669 Obesity, unspecified: Secondary | ICD-10-CM | POA: Diagnosis not present

## 2018-04-05 DIAGNOSIS — R7302 Impaired glucose tolerance (oral): Secondary | ICD-10-CM | POA: Diagnosis not present

## 2018-04-05 DIAGNOSIS — I10 Essential (primary) hypertension: Secondary | ICD-10-CM | POA: Diagnosis not present

## 2018-04-05 DIAGNOSIS — N521 Erectile dysfunction due to diseases classified elsewhere: Secondary | ICD-10-CM

## 2018-04-05 DIAGNOSIS — Z125 Encounter for screening for malignant neoplasm of prostate: Secondary | ICD-10-CM | POA: Diagnosis not present

## 2018-04-05 DIAGNOSIS — G43109 Migraine with aura, not intractable, without status migrainosus: Secondary | ICD-10-CM

## 2018-04-05 MED ORDER — SILDENAFIL CITRATE 20 MG PO TABS: ORAL_TABLET | ORAL | 5 refills | Status: DC

## 2018-04-05 MED ORDER — LISINOPRIL-HYDROCHLOROTHIAZIDE 20-12.5 MG PO TABS: 2.0000 | ORAL_TABLET | Freq: Every day | ORAL | 3 refills | Status: DC

## 2018-04-05 MED ORDER — PRAVASTATIN SODIUM 40 MG PO TABS: 40.0000 mg | ORAL_TABLET | Freq: Every day | ORAL | 3 refills | Status: DC

## 2018-04-05 MED ORDER — OMEPRAZOLE 40 MG PO CPDR: 40.0000 mg | DELAYED_RELEASE_CAPSULE | Freq: Every day | ORAL | 3 refills | Status: DC

## 2018-04-05 NOTE — Progress Notes (Signed)
Subjective:    Patient ID: Gerald Larsen, male    DOB: [birth year], 53 y.o.   MRN: [id number]  HPI He is here for complete examination.  He does have a previous history of diabetes but is presently not on any medications and his last few hemoglobin A1c's were in the normal range.  He also recently started a diet and exercise program.  This was precipitated by weight gain after he had some surgery.  He does have underlying arthritis and does occasionally get injections from orthopedics.  He has OSA but has not been using his CPAP.  He states he needs a new machine.  Has not had migraines in quite some time.  Has a previous history of hypogonadism but presently is not on any medications.  He also has had difficulty with erectile dysfunction in the past and tried Viagra and Cialis with minimal if any effect.  He continues on Prilosec for reflux and needs to use it daily.  He is also taking pravastatin as well as lisinopril and HCTZ.  Family and social history as well as health maintenance and immunizations was reviewed   Review of Systems  All other systems reviewed and are negative.      Objective:   Physical Exam BP 130/88 (BP Location: Left Arm, Patient Position: Sitting)   Pulse 79   Temp 98.1 F (36.7 C)   Ht 5\' 11"  (1.803 m)   Wt 271 lb (122.9 kg)   SpO2 99%   BMI 37.80 kg/m   General Appearance:    Alert, cooperative, no distress, appears stated age  Head:    Normocephalic, without obvious abnormality, atraumatic  Eyes:    PERRL, conjunctiva/corneas clear, EOM's intact, fundi    benign  Ears:    Normal TM's and external ear canals  Nose:   Nares normal, mucosa normal, no drainage or sinus   tenderness  Throat:   Lips, mucosa, and tongue normal; teeth and gums normal  Neck:   Supple, no lymphadenopathy;  thyroid:  no   enlargement/tenderness/nodules; no carotid   bruit or JVD     Lungs:     Clear to auscultation bilaterally without wheezes, rales or     ronchi; respirations  unlabored      Heart:    Regular rate and rhythm, S1 and S2 normal, no murmur, rub   or gallop  Breast Exam:    No chest wall tenderness, masses or gynecomastia  Abdomen:     Soft, non-tender, nondistended, normoactive bowel sounds,    no masses, no hepatosplenomegaly  Genitalia:    Normal male external genitalia without lesions.  Testicles without masses.  No inguinal hernias.  Rectal:   Deferred  Extremities:   No clubbing, cyanosis or edema  Pulses:   2+ and symmetric all extremities  Skin:   Skin color, texture, turgor normal, no rashes or lesions  Lymph nodes:   Cervical, supraclavicular, and axillary nodes normal  Neurologic:   CNII-XII intact, normal strength, sensation and gait; reflexes 2+ and symmetric throughout          Psych:   Normal mood, affect, hygiene and grooming.    Hemoglobin A1c is 6.2      Assessment & Plan:  Routine general medical examination at a health care facility - Plan: CBC with Differential/Platelet, Comprehensive metabolic panel, Lipid panel  Gastroesophageal reflux disease without esophagitis - Plan: omeprazole (PRILOSEC) 40 MG capsule  Glucose intolerance (impaired glucose tolerance) - Plan: CBC with Differential/Platelet,  Comprehensive metabolic panel  Hyperlipidemia, unspecified hyperlipidemia type - Plan: Lipid panel, pravastatin (PRAVACHOL) 40 MG tablet  Obesity (BMI 30.0-34.9) - Plan: CBC with Differential/Platelet, Comprehensive metabolic panel, Lipid panel  Migraine with aura and without status migrainosus, not intractable  Essential hypertension - Plan: CBC with Differential/Platelet, Comprehensive metabolic panel, lisinopril-hydrochlorothiazide (PRINZIDE,ZESTORETIC) 20-12.5 MG tablet  Sleep apnea, unspecified type  Erectile dysfunction due to diseases classified elsewhere - Plan: sildenafil (REVATIO) 20 MG tablet  Hypogonadism in male - Plan: Testosterone  Screening for prostate cancer - Plan: PSA  Need for Tdap vaccination -  Plan: Tdap vaccine greater than or equal to 7yo IM  Need for influenza vaccination - Plan: Flu Vaccine QUAD 6+ mos PF IM (Fluarix Quad PF)  Former smoker A prescription was written for him to get a new CPAP machine. I encouraged him to continue with his diet and exercise.  Explained that at the present time he is glucose intolerant but he is likely to become a diabetic at some time in the future. I will follow-up on his testosterone as well as PSA.  His immunizations were updated.

## 2018-04-06 LAB — LIPID PANEL
Chol/HDL Ratio: 3.3 ratio (ref 0.0–5.0)
Cholesterol, Total: 196 mg/dL (ref 100–199)
HDL: 60 mg/dL (ref >39)
LDL Calculated: 114 mg/dL — ABNORMAL HIGH (ref 0–99)
Triglycerides: 112 mg/dL (ref 0–149)
VLDL Cholesterol Cal: 22 mg/dL (ref 5–40)

## 2018-04-06 LAB — COMPREHENSIVE METABOLIC PANEL
ALT: 40 IU/L (ref 0–44)
AST: 30 IU/L (ref 0–40)
Albumin/Globulin Ratio: 2 (ref 1.2–2.2)
Albumin: 4.5 g/dL (ref 3.8–4.9)
Alkaline Phosphatase: 68 IU/L (ref 39–117)
BUN/Creatinine Ratio: 23 — ABNORMAL HIGH (ref 9–20)
BUN: 20 mg/dL (ref 6–24)
Bilirubin Total: 0.2 mg/dL (ref 0.0–1.2)
CO2: 26 mmol/L (ref 20–29)
Calcium: 9.8 mg/dL (ref 8.7–10.2)
Chloride: 98 mmol/L (ref 96–106)
Creatinine, Ser: 0.88 mg/dL (ref 0.76–1.27)
GFR calc Af Amer: 113 mL/min/{1.73_m2} (ref >59)
GFR calc non Af Amer: 97 mL/min/{1.73_m2} (ref >59)
Globulin, Total: 2.2 g/dL (ref 1.5–4.5)
Glucose: 108 mg/dL — ABNORMAL HIGH (ref 65–99)
Potassium: 4.2 mmol/L (ref 3.5–5.2)
Sodium: 138 mmol/L (ref 134–144)
Total Protein: 6.7 g/dL (ref 6.0–8.5)

## 2018-04-06 LAB — CBC WITH DIFFERENTIAL/PLATELET
Basophils Absolute: 0 10*3/uL (ref 0.0–0.2)
Basos: 1 %
EOS (ABSOLUTE): 0.2 10*3/uL (ref 0.0–0.4)
Eos: 2 %
Hematocrit: 44.2 % (ref 37.5–51.0)
Hemoglobin: 15.1 g/dL (ref 13.0–17.7)
Immature Grans (Abs): 0.1 10*3/uL (ref 0.0–0.1)
Immature Granulocytes: 1 %
Lymphocytes Absolute: 1.9 10*3/uL (ref 0.7–3.1)
Lymphs: 25 %
MCH: 29.3 pg (ref 26.6–33.0)
MCHC: 34.2 g/dL (ref 31.5–35.7)
MCV: 86 fL (ref 79–97)
Monocytes Absolute: 0.5 10*3/uL (ref 0.1–0.9)
Monocytes: 6 %
Neutrophils Absolute: 5 10*3/uL (ref 1.4–7.0)
Neutrophils: 65 %
Platelets: 245 10*3/uL (ref 150–450)
RBC: 5.15 x10E6/uL (ref 4.14–5.80)
RDW: 14.4 % (ref 11.6–15.4)
WBC: 7.7 10*3/uL (ref 3.4–10.8)

## 2018-04-06 LAB — TESTOSTERONE: Testosterone: 649 ng/dL (ref 264–916)

## 2018-04-06 LAB — PSA: Prostate Specific Ag, Serum: 0.2 ng/mL (ref 0.0–4.0)

## 2018-05-06 ENCOUNTER — Telehealth: Payer: Self-pay | Admitting: Family Medicine

## 2018-05-06 MED ORDER — ALPRAZOLAM 0.25 MG PO TABS: 0.2500 mg | ORAL_TABLET | Freq: Two times a day (BID) | ORAL | 0 refills | Status: DC | PRN

## 2018-05-06 NOTE — Telephone Encounter (Signed)
Pt says he needs anxiety med due to him being stressed. Pt advsie he thinks it is because of his wife going out of town. Pt says he has had anxiety med in the past to keep him calm for a MRI but does not know the name. Please advise [facility]

## 2018-05-06 NOTE — Addendum Note (Signed)
Addended by: Denita Lung on: 05/06/2018 01:31 PM   Modules accepted: Orders

## 2018-05-06 NOTE — Telephone Encounter (Signed)
Received a call. Pt is requesting a refill on anxiety med. [facility] [place] Please call pt with any questions.

## 2018-05-06 NOTE — Telephone Encounter (Signed)
My record does not show any med find out for me

## 2018-07-06 IMAGING — MR MR SHOULDER*L* W/O CM
5 series · 33 of 40 positions shown · non-contrast
Comparison: None.

CLINICAL DATA: Chronic left shoulder pain status post lifting
injury 3 years ago. Prior rotator cuff repair in February 2014.

EXAM:
MRI OF THE LEFT SHOULDER WITHOUT CONTRAST
TECHNIQUE: Multiplanar, multisequence MR imaging of the shoulder was performed.
No intravenous contrast was administered.

[Series 4: T2 fat-sat · axial · 4.0mm · 0.55mm/px · z∈[-52,+58]mm · 8 of 25 slices shown (1 of 3)]
[im 1/25]
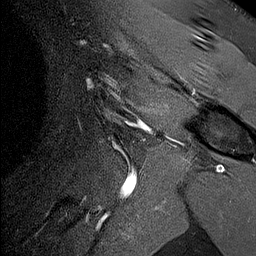
[im 4/25]
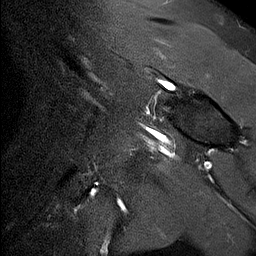
[im 7/25]
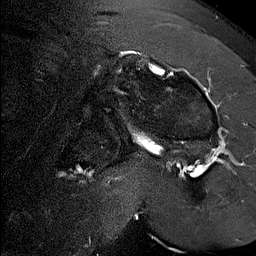
[im 11/25]
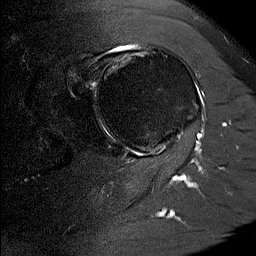
[im 14/25]
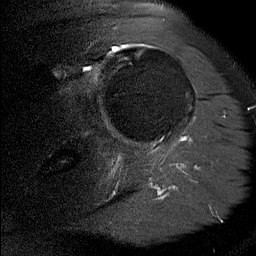
[im 18/25]
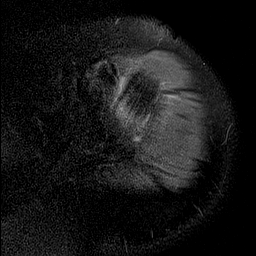
[im 21/25]
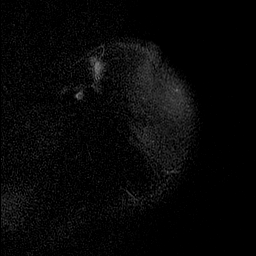
[im 25/25]
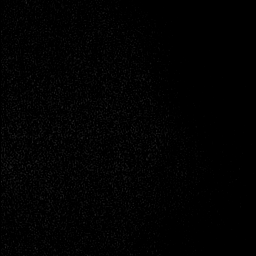

[Series 5: T2 fat-sat · oblique · 4.0mm · 0.55mm/px · 8 of 25 slices shown (2 of 3)]
[im 1/25]
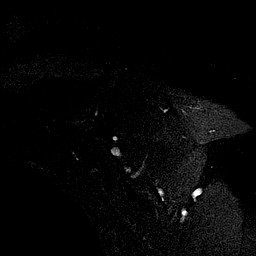
[im 4/25]
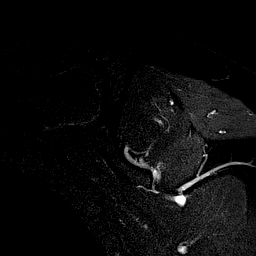
[im 7/25]
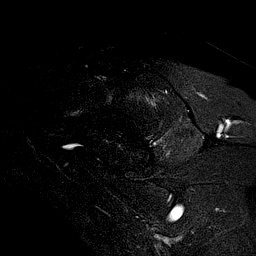
[im 11/25]
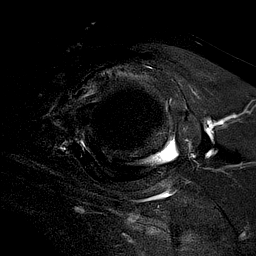
[im 14/25]
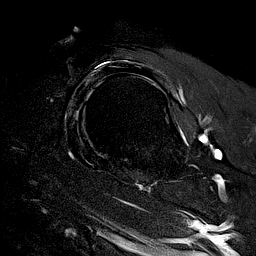
[im 18/25]
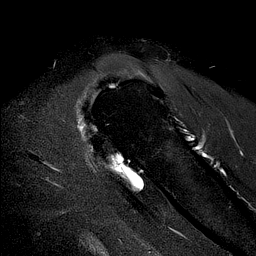
[im 21/25]
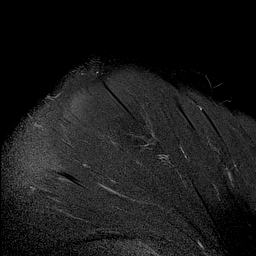
[im 25/25]
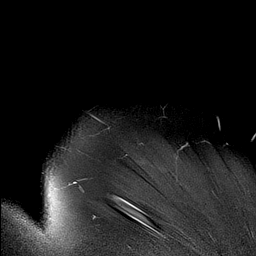

[Series 6: T1 · oblique · 4.0mm · 0.22mm/px · 1 of 25 slices shown]
[im 1/25]
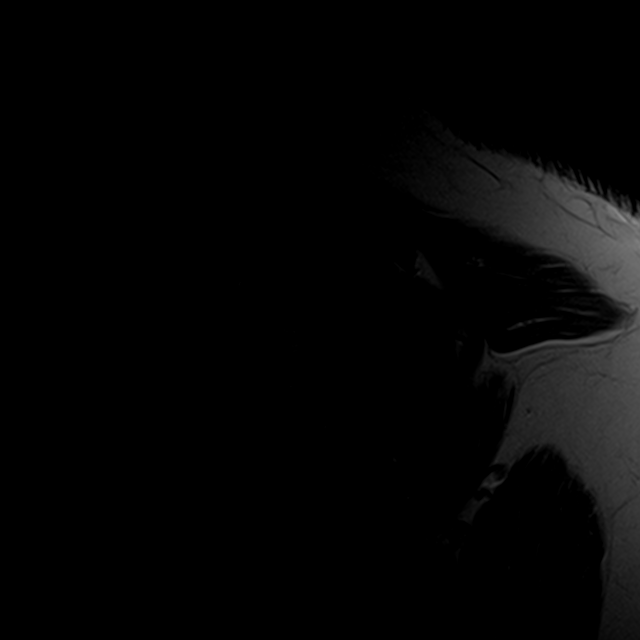

[Series 7: T2 fat-sat · sagittal · 4.0mm · 0.27mm/px · 8 of 25 slices shown (3 of 3)]
[im 1/25]
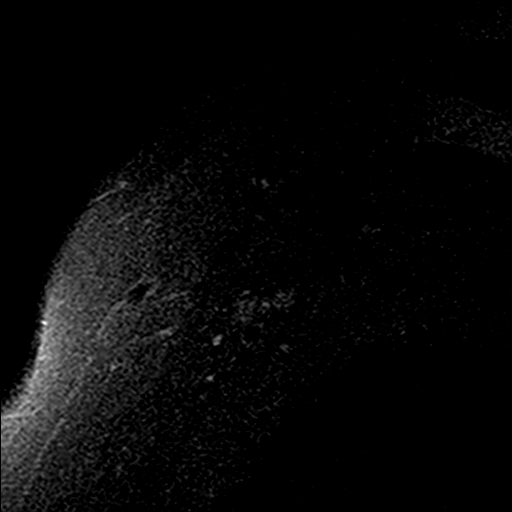
[im 4/25]
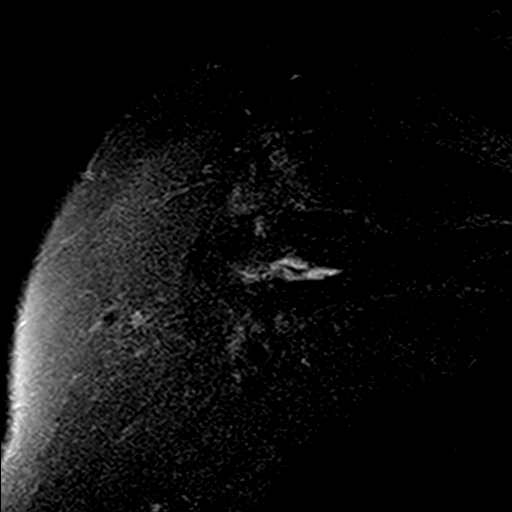
[im 7/25]
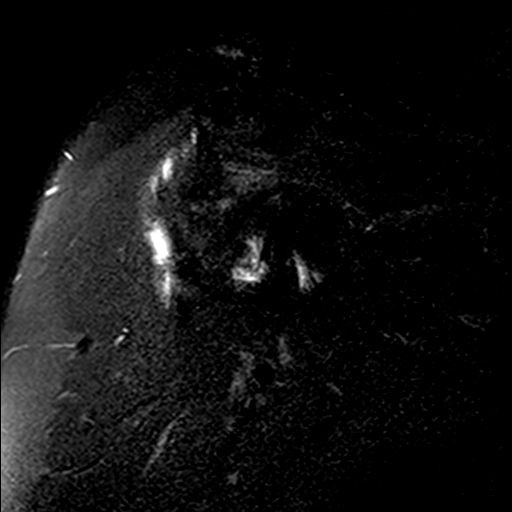
[im 11/25]
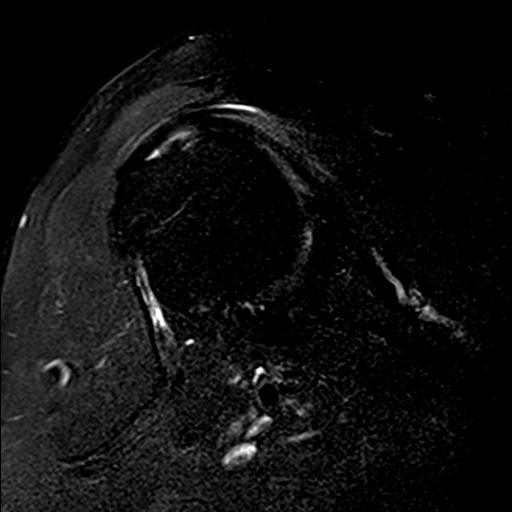
[im 14/25]
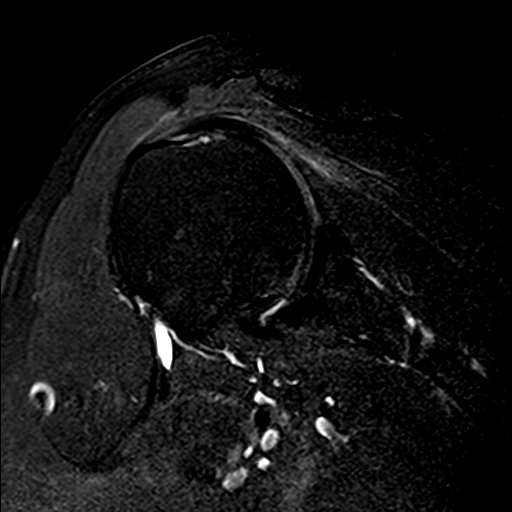
[im 18/25]
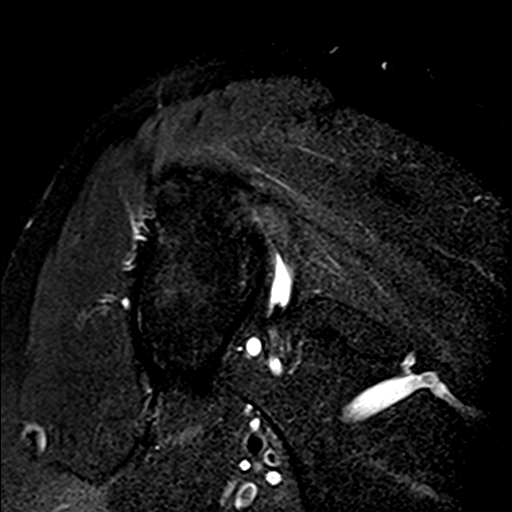
[im 21/25]
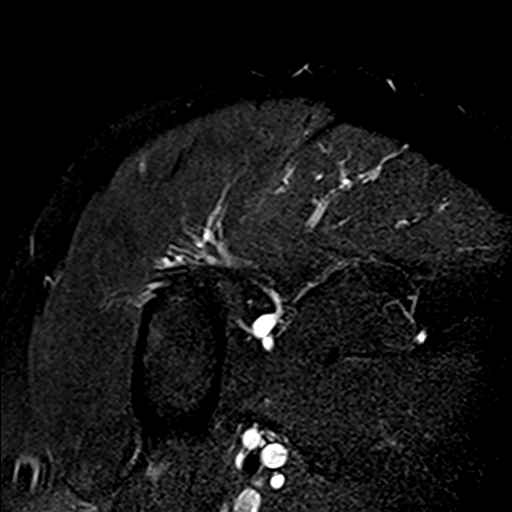
[im 25/25]
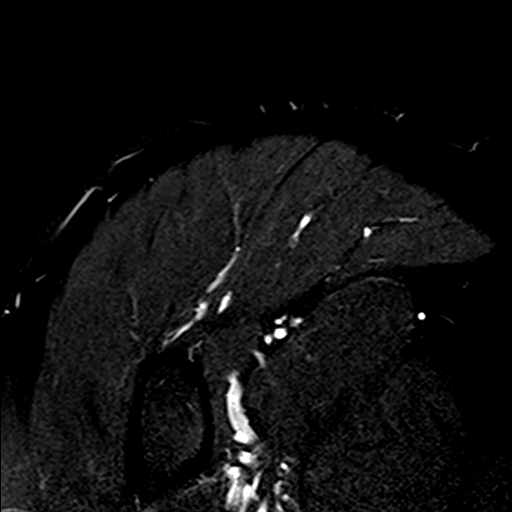

[Series 8: PD · sagittal · 4.0mm · 0.44mm/px · 8 of 25 slices shown]
[im 1/25]
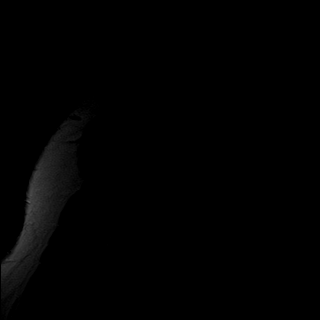
[im 4/25]
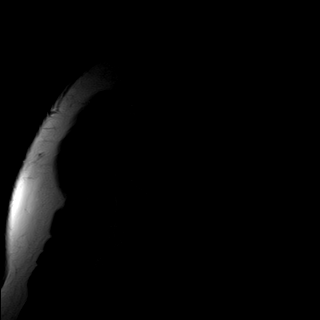
[im 7/25]
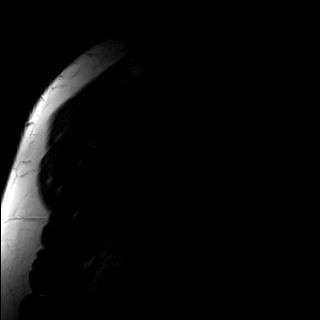
[im 11/25]
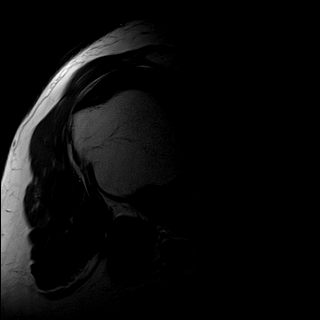
[im 14/25]
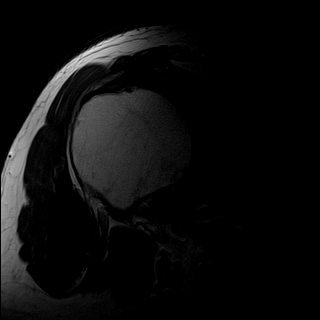
[im 18/25]
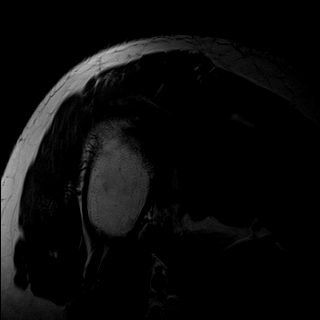
[im 21/25]
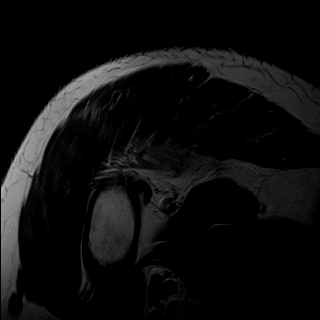
[im 25/25]
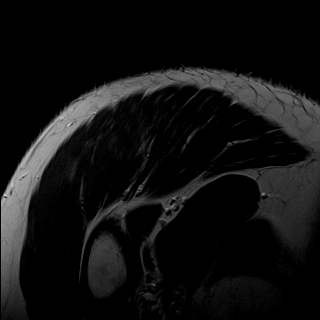

[33 of 40 positions shown; findings below may reference images not displayed]

FINDINGS: Rotator cuff: Moderate supraspinatus and infraspinatus tendinosis
with low-grade articular surface tearing of the mid to distal
posterior supraspinatus tendon fibers. There may be low-grade bursal
surface tearing of the far anterior distal supraspinatus tendon at
the footprint. Mild subscapularis tendinosis with low-grade
articular surface tearing of the distal tendon. The teres minor
tendon is intact.

Muscles: Faint edema within the infraspinatus muscle along the
myotendinous junction. No muscle atrophy.

Biceps long head: The intra-articular biceps tendon appears medially
subluxed, but intact.

Acromioclavicular Joint: Prior distal clavicle resection. Type II
acromion. Mild subacromial/subdeltoid bursal fluid.

Glenohumeral Joint: Mild partial-thickness cartilage loss. No
significant joint effusion.

Labrum: Grossly intact, but evaluation is limited by lack of
intraarticular fluid. Diminutive appearance of the anterior labrum
may be related to prior labral debridement.

Bones:  No marrow abnormality, fracture or dislocation.

Other: None.
IMPRESSION: 1. Moderate supraspinatus and infraspinatus tendinosis with
low-grade articular surface tearing of the mid to distal posterior
supraspinatus tendon fibers. Possible low-grade bursal surface
tearing of the far anterior distal supraspinatus tendon at the
footprint.
2. Mild subscapularis tendinosis with low-grade articular surface
tearing of the distal tendon.
3. Faint edema within the infraspinatus muscle along the
myotendinous junction, likely reflecting low-grade strain.
4. Medial subluxation of the biceps tendon, which appears intact.
5. Mild glenohumeral degenerative changes.
6. Mild subacromial/subdeltoid bursitis.

## 2018-07-14 ENCOUNTER — Encounter: Payer: Self-pay | Admitting: Medical

## 2018-07-14 ENCOUNTER — Ambulatory Visit: Payer: BLUE CROSS/BLUE SHIELD | Admitting: Medical

## 2018-07-14 ENCOUNTER — Other Ambulatory Visit: Payer: Self-pay

## 2018-07-14 VITALS — Ht 71.0 in | Wt 250.0 lb

## 2018-07-14 DIAGNOSIS — L089 Local infection of the skin and subcutaneous tissue, unspecified: Secondary | ICD-10-CM

## 2018-07-14 DIAGNOSIS — L03011 Cellulitis of right finger: Secondary | ICD-10-CM

## 2018-07-14 MED ORDER — HYDROCODONE-ACETAMINOPHEN 5-325 MG PO TABS: 1.0000 | ORAL_TABLET | Freq: Four times a day (QID) | ORAL | 0 refills | Status: DC | PRN

## 2018-07-14 MED ORDER — AMOXICILLIN-POT CLAVULANATE 875-125 MG PO TABS: 1.0000 | ORAL_TABLET | Freq: Two times a day (BID) | ORAL | 0 refills | Status: DC

## 2018-07-14 NOTE — Progress Notes (Signed)
Subjective: Chief Complaint  Patient presents with  . Wound Infection    around nail area of the middle finger of his right hand about 4-5 days ago. Had a blister there and then yesterday his finger started swelling.    He reports that he was cutting weeds around a great vine in his yard 5 days ago, had some scrapes and bumps while he was doing this.  He was using scissors.  However he started with a blister on his right middle finger on the top of the hand, but within the last few days he is developed redness and swelling with a blister was and also has redness and swelling and a wound at the fingernail base in the soft tissue.  In the last few days the swelling and pain is gotten gradually worse to the point his finger feels like it could pop.  He has been using Neosporin the last 2 days.  Is not improving though.  He denies fever, no nausea vomiting, no malaise, no other skin concern or other concern.  Otherwise feels fine.  His tetanus was updated this year at his physical.  No other complaint.  ROS as in subjective   Objective: Ht 5\' 11"  (1.803 m)   Wt 250 lb (113.4 kg)   BMI 34.87 kg/m   Gen: wd, wn, nad Skin: Right middle/third finger dorsal surface with 3 mm roundish ulceration wound over the middle phalanx, another similar ulcerated wound 3 mm x 2 mm at the base of the right middle fingernail and the soft tissue, the right middle finger is swollen in general and there is erythema surrounding both wounds.  Otherwise fingers and hand unremarkable 2+ upper extremity pulses, normal cap refill    Assessment: Encounter Diagnoses  Name Primary?  . Finger infection Yes  . Paronychia of finger, right     Plan: We discussed the findings.  His tetanus is up-to-date this year 2020.  Begin Augmentin antibiotic, take a probiotic while on this medication.  Advised finger soaks with warm soapy water with some Epsom salts added 2 or 3 times a day the next few days.  Otherwise keep the finger  covered with bandage and use topical neosporin he has at home.   Avoid getting the wound dirty.  He does work in [employer].  Advised to monitor the wound the next few days and have worsening over the next 48 hours to call back.  We discussed possible complications including felon.   Otherwise recheck with call back in 4 to 5 days.  Gerald Larsen was seen today for wound infection.  Diagnoses and all orders for this visit:  Finger infection  Paronychia of finger, right  Other orders -     amoxicillin-clavulanate (AUGMENTIN) 875-125 MG tablet; Take 1 tablet by mouth 2 (two) times daily. -     HYDROcodone-acetaminophen (NORCO) 5-325 MG tablet; Take 1 tablet by mouth every 6 (six) hours as needed for moderate pain.

## 2018-07-14 NOTE — Patient Instructions (Signed)
Recommendations  Begin Augmentin antibiotic twice daily for 10 days  Use an over-the-counter probiotic while you are on the Augmentin  Begin ibuprofen over-the-counter 200 mg, 3 tablets or 600 mg twice daily the next few days for pain and inflammation  For worse pain you can use the hydrocodone pain pill as needed but caution as this causes sedation  Keep the finger clean with soap and water  You can do soapy Epsom salts warm water soaks of the finger 2 or 3 times a day  Otherwise wrap the finger with a gauze and bandage and use Neosporin topically to the wounds  Avoid getting the finger dirty or contaminated  If worse in the next few days call or come back in

## 2018-08-02 DIAGNOSIS — M25561 Pain in right knee: Secondary | ICD-10-CM | POA: Diagnosis not present

## 2018-08-02 DIAGNOSIS — M25511 Pain in right shoulder: Secondary | ICD-10-CM | POA: Diagnosis not present

## 2018-09-22 DIAGNOSIS — M25572 Pain in left ankle and joints of left foot: Secondary | ICD-10-CM | POA: Diagnosis not present

## 2018-10-25 DIAGNOSIS — H6982 Other specified disorders of Eustachian tube, left ear: Secondary | ICD-10-CM | POA: Diagnosis not present

## 2018-10-31 DIAGNOSIS — M25511 Pain in right shoulder: Secondary | ICD-10-CM | POA: Diagnosis not present

## 2018-10-31 DIAGNOSIS — M7541 Impingement syndrome of right shoulder: Secondary | ICD-10-CM | POA: Diagnosis not present

## 2018-10-31 DIAGNOSIS — M75101 Unspecified rotator cuff tear or rupture of right shoulder, not specified as traumatic: Secondary | ICD-10-CM | POA: Diagnosis not present

## 2018-12-06 ENCOUNTER — Telehealth: Payer: Self-pay | Admitting: Family Medicine

## 2018-12-06 NOTE — Telephone Encounter (Signed)
Done appt tomorrow . [place]

## 2018-12-06 NOTE — Telephone Encounter (Signed)
Pt called and wanted to see if he could get a refill on Norco for his migraines [facility] [place].

## 2018-12-06 NOTE — Telephone Encounter (Signed)
Needs virtual visit 

## 2018-12-07 ENCOUNTER — Encounter: Payer: Self-pay | Admitting: Family Medicine

## 2018-12-07 ENCOUNTER — Other Ambulatory Visit: Payer: Self-pay

## 2018-12-07 ENCOUNTER — Ambulatory Visit: Payer: BC Managed Care – PPO | Admitting: Family Medicine

## 2018-12-07 VITALS — Ht 72.0 in | Wt 260.0 lb

## 2018-12-07 DIAGNOSIS — G43809 Other migraine, not intractable, without status migrainosus: Secondary | ICD-10-CM

## 2018-12-07 DIAGNOSIS — G4730 Sleep apnea, unspecified: Secondary | ICD-10-CM | POA: Diagnosis not present

## 2018-12-07 DIAGNOSIS — F419 Anxiety disorder, unspecified: Secondary | ICD-10-CM | POA: Diagnosis not present

## 2018-12-07 DIAGNOSIS — M2550 Pain in unspecified joint: Secondary | ICD-10-CM

## 2018-12-07 MED ORDER — HYDROCODONE-ACETAMINOPHEN 5-325 MG PO TABS: 1.0000 | ORAL_TABLET | Freq: Four times a day (QID) | ORAL | 0 refills | Status: DC | PRN

## 2018-12-07 NOTE — Progress Notes (Signed)
   Subjective:    Patient ID: Gerald Larsen, male    DOB: 1965/07/11, 53 y.o.   MRN: [id number]  HPI Documentation for virtual telephone encounter. Documentation for virtual audio and video telecommunications through WebEx encounter: IThe patient was located at home. The provider was located in the office. The patient did consent to this visit and is aware of possible charges through their insurance for this visit. The other persons participating in this telemedicine service were none. Time spent on call was 15 minutes This virtual service is not related to other E/M service within previous 7 days. He has a history of migraine headaches and rarely has had difficulty with them however in the last month he has had 6 of these which is unusual for him.  In the past he had tried multiple triptan's without success and is also been involved in a research study which was apparently also not successful he also has had shoulder and knee arthritis type changes and was getting tramadol from orthopedics.  He also complains of difficulty with fatigue, excessive sleeping and anxiety.  He states that he cannot function at the level that he would like because of the fatigue and arthralgias.  He does have underlying OSA but has not gotten the machine due to cost.  He readily admits to having some depression symptoms because of all the problems that he is having interfering with his ability to function.   Review of Systems     Objective:   Physical Exam Alert and in no distress otherwise not examined       Assessment & Plan:  Other migraine without status migrainosus, not intractable - Plan: HYDROcodone-acetaminophen (NORCO) 5-325 MG tablet  Sleep apnea, unspecified type  Arthralgia, unspecified joint  Anxiety I explained that I would recommend getting the CPAP to see how much of his aches and pains as well as sleep issues will get better with this.  I will also renew his Norco as he is only using  this for migraine.  Discussed the use of other pain medications.  He will be seeing orthopedics soon to discuss further care for his knees and shoulders.  Apparently he does have bone-on-bone in these areas.

## 2018-12-21 DIAGNOSIS — Z79891 Long term (current) use of opiate analgesic: Secondary | ICD-10-CM | POA: Diagnosis not present

## 2018-12-23 DIAGNOSIS — Z20828 Contact with and (suspected) exposure to other viral communicable diseases: Secondary | ICD-10-CM | POA: Diagnosis not present

## 2019-01-18 DIAGNOSIS — M1712 Unilateral primary osteoarthritis, left knee: Secondary | ICD-10-CM | POA: Diagnosis not present

## 2019-03-23 ENCOUNTER — Telehealth: Payer: Self-pay | Admitting: Family Medicine

## 2019-03-23 DIAGNOSIS — G43809 Other migraine, not intractable, without status migrainosus: Secondary | ICD-10-CM

## 2019-03-23 MED ORDER — HYDROCODONE-ACETAMINOPHEN 5-325 MG PO TABS: 1.0000 | ORAL_TABLET | Freq: Four times a day (QID) | ORAL | 0 refills | Status: DC | PRN

## 2019-03-23 NOTE — Telephone Encounter (Signed)
Pt called for refills of Norco for headaches. Pt can be reached at [phone]. [facility] uses [facility] [place].

## 2019-04-12 ENCOUNTER — Other Ambulatory Visit: Payer: Self-pay | Admitting: Family Medicine

## 2019-04-12 DIAGNOSIS — E785 Hyperlipidemia, unspecified: Secondary | ICD-10-CM

## 2019-04-19 DIAGNOSIS — I10 Essential (primary) hypertension: Secondary | ICD-10-CM | POA: Diagnosis not present

## 2019-04-19 DIAGNOSIS — Z79899 Other long term (current) drug therapy: Secondary | ICD-10-CM | POA: Diagnosis not present

## 2019-04-19 DIAGNOSIS — Z6841 Body Mass Index (BMI) 40.0 and over, adult: Secondary | ICD-10-CM | POA: Diagnosis not present

## 2019-04-25 ENCOUNTER — Ambulatory Visit (HOSPITAL_COMMUNITY)
Admission: EM | Admit: 2019-04-25 | Discharge: 2019-04-25 | Disposition: A | Discharge status: Home / Self Care | Payer: BLUE CROSS/BLUE SHIELD

## 2019-05-05 ENCOUNTER — Other Ambulatory Visit: Payer: Self-pay | Admitting: Family Medicine

## 2019-05-05 DIAGNOSIS — E785 Hyperlipidemia, unspecified: Secondary | ICD-10-CM

## 2019-05-10 ENCOUNTER — Other Ambulatory Visit: Payer: Self-pay | Admitting: Family Medicine

## 2019-05-10 DIAGNOSIS — K219 Gastro-esophageal reflux disease without esophagitis: Secondary | ICD-10-CM

## 2019-05-10 DIAGNOSIS — I10 Essential (primary) hypertension: Secondary | ICD-10-CM

## 2019-05-23 DIAGNOSIS — M25561 Pain in right knee: Secondary | ICD-10-CM | POA: Diagnosis not present

## 2019-05-23 DIAGNOSIS — M25562 Pain in left knee: Secondary | ICD-10-CM | POA: Diagnosis not present

## 2019-05-23 DIAGNOSIS — M170 Bilateral primary osteoarthritis of knee: Secondary | ICD-10-CM | POA: Diagnosis not present

## 2019-06-04 ENCOUNTER — Other Ambulatory Visit: Payer: Self-pay | Admitting: Family Medicine

## 2019-06-04 DIAGNOSIS — E785 Hyperlipidemia, unspecified: Secondary | ICD-10-CM

## 2019-06-05 ENCOUNTER — Telehealth: Payer: Self-pay

## 2019-06-07 NOTE — Telephone Encounter (Signed)
Error

## 2019-06-09 ENCOUNTER — Telehealth: Payer: Self-pay

## 2019-06-09 DIAGNOSIS — G43809 Other migraine, not intractable, without status migrainosus: Secondary | ICD-10-CM

## 2019-06-09 NOTE — Telephone Encounter (Signed)
Let him know that I would like to try some of the new migraine medicines when he comes in.  If he wants I can give him some samples before his visit

## 2019-06-09 NOTE — Telephone Encounter (Signed)
Pt. Called to get scheduled for his CPE and I got that scheduled on 07/13/19. He also wanted to know if you could refill his Norco for migraines.

## 2019-06-12 NOTE — Telephone Encounter (Signed)
LVM for pt to call back for info from Dr. Manon Hilding

## 2019-06-14 NOTE — Telephone Encounter (Signed)
Lvm for pt to call back for info concerning a migraine sample Dr. Redmond School want pt to try. Gerald Larsen

## 2019-06-21 ENCOUNTER — Other Ambulatory Visit: Payer: Self-pay

## 2019-06-21 ENCOUNTER — Encounter (HOSPITAL_COMMUNITY): Payer: Self-pay

## 2019-06-21 ENCOUNTER — Telehealth: Payer: Self-pay | Admitting: Family Medicine

## 2019-06-21 ENCOUNTER — Emergency Department (HOSPITAL_COMMUNITY)
Admission: EM | Admit: 2019-06-21 | Discharge: 2019-06-21 | Disposition: A | Discharge status: Home / Self Care | Payer: BC Managed Care – PPO | Attending: Emergency Medicine | Admitting: Emergency Medicine

## 2019-06-21 DIAGNOSIS — Z87891 Personal history of nicotine dependence: Secondary | ICD-10-CM | POA: Insufficient documentation

## 2019-06-21 DIAGNOSIS — I10 Essential (primary) hypertension: Secondary | ICD-10-CM | POA: Diagnosis not present

## 2019-06-21 DIAGNOSIS — R110 Nausea: Secondary | ICD-10-CM

## 2019-06-21 DIAGNOSIS — Z79899 Other long term (current) drug therapy: Secondary | ICD-10-CM | POA: Insufficient documentation

## 2019-06-21 DIAGNOSIS — E119 Type 2 diabetes mellitus without complications: Secondary | ICD-10-CM | POA: Insufficient documentation

## 2019-06-21 DIAGNOSIS — R112 Nausea with vomiting, unspecified: Secondary | ICD-10-CM | POA: Diagnosis not present

## 2019-06-21 LAB — COMPREHENSIVE METABOLIC PANEL
ALT: 72 U/L — ABNORMAL HIGH (ref 0–44)
AST: 57 U/L — ABNORMAL HIGH (ref 15–41)
Albumin: 4.2 g/dL (ref 3.5–5.0)
Alkaline Phosphatase: 62 U/L (ref 38–126)
Anion gap: 11 (ref 5–15)
BUN: 12 mg/dL (ref 6–20)
CO2: 27 mmol/L (ref 22–32)
Calcium: 9.9 mg/dL (ref 8.9–10.3)
Chloride: 104 mmol/L (ref 98–111)
Creatinine, Ser: 0.87 mg/dL (ref 0.61–1.24)
GFR calc Af Amer: >60 mL/min (ref >60)
GFR calc non Af Amer: >60 mL/min (ref >60)
Glucose, Bld: 126 mg/dL — ABNORMAL HIGH (ref 70–99)
Potassium: 4 mmol/L (ref 3.5–5.1)
Sodium: 142 mmol/L (ref 135–145)
Total Bilirubin: 0.8 mg/dL (ref 0.3–1.2)
Total Protein: 7.2 g/dL (ref 6.5–8.1)

## 2019-06-21 LAB — CBC
HCT: 46.2 % (ref 39.0–52.0)
Hemoglobin: 15.2 g/dL (ref 13.0–17.0)
MCH: 29.2 pg (ref 26.0–34.0)
MCHC: 32.9 g/dL (ref 30.0–36.0)
MCV: 88.8 fL (ref 80.0–100.0)
Platelets: 248 10*3/uL (ref 150–400)
RBC: 5.2 MIL/uL (ref 4.22–5.81)
RDW: 14.1 % (ref 11.5–15.5)
WBC: 7.1 10*3/uL (ref 4.0–10.5)
nRBC: 0 % (ref 0.0–0.2)

## 2019-06-21 LAB — LIPASE, BLOOD: Lipase: 23 U/L (ref 11–51)

## 2019-06-21 MED ORDER — ONDANSETRON HCL 4 MG PO TABS: 4.0000 mg | ORAL_TABLET | Freq: Four times a day (QID) | ORAL | 0 refills | Status: DC

## 2019-06-21 MED ORDER — SODIUM CHLORIDE 0.9% FLUSH: 3.0000 mL | Freq: Once | INTRAVENOUS | Status: DC

## 2019-06-21 NOTE — ED Triage Notes (Signed)
Patient brought in by wife.   Patient reports he has been on tramadol for the past 6 months for shoulder and knee pain.   Patient reports he thinks he started to abuse it.  Patient reports on Sunday he decided to stop taking it and flushed.   Patient now is having anxiety, tremors, fatigue, trouble sleeping, and depression.   C/O nausea denies emesis  Patient reports when he is alone his depression is worse.   Denies SI or HI.  Denies alcohol or other drug use.   A/Ox4 Ambulatory in triage.

## 2019-06-21 NOTE — Telephone Encounter (Signed)
Called states [facility] has [facility] and that he had been taking too many of their pain pills and he flushed them down the toilet on Sunday night.  And he is going through anxiety right now, can't focus, don't know what to do, started crying. I spoke with Dr Redmond School he advised him to go to [facility] to [facility] and they can help him.  Dr.Lalonde advised his body is going through withdrawals, so ER can help.  Advised pt and wife of same.

## 2019-06-21 NOTE — Discharge Instructions (Addendum)
Please return for any problem.  Follow-up with your regular care provider as instructed.  Use Zofran as prescribed for treatment of nausea.  Use Benadryl (25 to 50 mg taken orally every 6 hours) as a sleep aid as instructed.

## 2019-06-21 NOTE — ED Provider Notes (Signed)
[facility] DEPT Provider Note   CSN: [id number] Arrival date & time: 06/21/19  1722     History Chief Complaint  Patient presents with  . Withdrawal  . Nausea    Gerald Larsen is a 54 y.o. male.  54 year old male with prior medical history as detailed below presents for evaluation of possible withdrawal symptoms from tramadol use.  Patient reports that he has been on tramadol for 6 months.  Over the weekend he decided he did not want to take anymore.  He stopped taking all of his tramadol on Sunday.  Since stopping the use of tramadol he has developed difficulty sleeping.  He complains of mild nausea.  He did not vomit.  He denies fever, chest pain, shortness of breath, or other specific complaint.  The history is provided by the patient and medical records.  Illness Location:  Withdrawal type symptoms after stopping tramadol use Severity:  Mild Onset quality:  Gradual Duration:  3 days Timing:  Constant Progression:  Waxing and waning Chronicity:  New Associated symptoms: no fever and no shortness of breath        Past Medical History:  Diagnosis Date  . Acute meniscal tear of left knee   . Anxiety   . Arthritis   . GERD (gastroesophageal reflux disease)   . Hyperlipidemia   . Hypertension   . Hypogonadism male   . Migraines   . OSA on CPAP    and uses mouthpiece   . Type 2 diabetes mellitus ([place])   . Wears contact lenses     Patient Active Problem List   Diagnosis Date Noted  . Hypogonadism in male 04/05/2018  . Former smoker 04/05/2018  . Erectile dysfunction due to diseases classified elsewhere 09/10/2014  . Arthritis of left acromioclavicular joint 03/12/2014  . Rotator cuff tear arthropathy 03/12/2014  . Sleep apnea 09/29/2012  . GERD (gastroesophageal reflux disease) 09/29/2012  . Obesity (BMI 30.0-34.9) 09/29/2012  . Glucose intolerance (impaired glucose tolerance) 01/13/2011  . Hyperlipidemia 01/13/2011  .  Hypertension 01/13/2011  . Migraine 01/13/2011    Past Surgical History:  Procedure Laterality Date  . CARDIAC CATHETERIZATION  2013   cardiac cath 5/15.   Normal results per patient  . EXAM UNDER ANESTHESIA WITH MANIPULATION OF SHOULDER Left 03/12/2014   Procedure: EXAM UNDER ANESTHESIA WITH MANIPULATION OF SHOULDER;  Surgeon: Johnn Hai, MD;  Location: [facility];  Service: Orthopedics;  Laterality: Left;  . KNEE ARTHROSCOPY Bilateral 1990's  . KNEE ARTHROSCOPY WITH MEDIAL MENISECTOMY Left 03/24/2013   Procedure: LEFT KNEE ARTHROSCOPY WITH PARTIAL MEDIAL MENISECTOMY, EUA/MUA;  Surgeon: Johnn Hai, MD;  Location: [facility];  Service: Orthopedics;  Laterality: Left;  . LAPAROSCOPIC CHOLECYSTECTOMY  01-17-2004  . [place] SURGERY  2000  . SHOULDER ARTHROSCOPY WITH ROTATOR CUFF REPAIR AND SUBACROMIAL DECOMPRESSION Left 03/12/2014   Procedure: LEFT SHOULDER ARTHROSCOPY WITH DISTAL CLAVICLE RESECTION/SUBACROMIAL DECOMPRESSION/LABRAL DEBRIDEMENT/ROTATOR CUFF DEBRIDEMENT/SUBACROMIAL RESECTION;  Surgeon: Johnn Hai, MD;  Location: [facility];  Service: Orthopedics;  Laterality: Left;       Family History  Problem Relation Age of Onset  . Arthritis Mother   . Heart disease Mother   . Diabetes Mother   . Depression Mother   . Arthritis Father   . Heart disease Father   . Colon cancer Neg Hx     Social History   Tobacco Use  . Smoking status: Former Smoker    Quit date: 10/27/1988    Years  since quitting: 30.6  . Smokeless tobacco: Never Used  Substance Use Topics  . Alcohol use: Yes    Comment: rare  . Drug use: No    Comment: hx of marijuana use as a teenager     Home Medications Prior to Admission medications   Medication Sig Start Date End Date Taking? Authorizing Provider  acetaminophen (TYLENOL) 500 MG tablet Take 500 mg by mouth every 6 (six) hours as needed for headache. PRN    [provider]  ALPRAZolam (XANAX) 0.25 MG tablet Take 1  tablet (0.25 mg total) by mouth 2 (two) times daily as needed for anxiety. Patient not taking: Reported on 07/14/2018 05/06/18   Denita Lung, MD  amoxicillin-clavulanate (AUGMENTIN) 875-125 MG tablet Take 1 tablet by mouth 2 (two) times daily. Patient not taking: Reported on 12/07/2018 07/14/18   Tysinger, Camelia Eng, PA-C  HYDROcodone-acetaminophen (NORCO) 5-325 MG tablet Take 1 tablet by mouth every 6 (six) hours as needed for moderate pain. 03/23/19   Denita Lung, MD  lisinopril-hydrochlorothiazide (ZESTORETIC) 20-12.5 MG tablet TAKE 2 TABLETS BY MOUTH EVERY DAY 05/10/19   Denita Lung, MD  omeprazole (PRILOSEC) 40 MG capsule TAKE 1 CAPSULE BY MOUTH EVERY DAY 05/10/19   Denita Lung, MD  ondansetron (ZOFRAN) 4 MG tablet Take 1 tablet (4 mg total) by mouth every 6 (six) hours. 06/21/19   Valarie Merino, MD  pravastatin (PRAVACHOL) 40 MG tablet TAKE 1 TABLET BY MOUTH EVERY DAY 06/05/19   Denita Lung, MD  sildenafil (REVATIO) 20 MG tablet Take up to 5 pills as needed for erections. 04/05/18   Denita Lung, MD    Allergies    Patient has no known allergies.  Review of Systems   Review of Systems  Constitutional: Negative for fever.  Respiratory: Negative for shortness of breath.   All other systems reviewed and are negative.   Physical Exam Updated Vital Signs BP (!) 150/94 (BP Location: Left Arm)   Pulse 77   Temp 98.1 F (36.7 C) (Oral)   Resp 18   Ht 6' (1.829 m)   Wt 127 kg   SpO2 97%   BMI 37.97 kg/m   Physical Exam Vitals and nursing note reviewed.  Constitutional:      General: He is not in acute distress.    Appearance: Normal appearance. He is well-developed.  HENT:     Head: Normocephalic and atraumatic.  Eyes:     Conjunctiva/sclera: Conjunctivae normal.     Pupils: Pupils are equal, round, and reactive to light.  Cardiovascular:     Rate and Rhythm: Normal rate and regular rhythm.     Heart sounds: Normal heart sounds.  Pulmonary:     Effort:  Pulmonary effort is normal. No respiratory distress.     Breath sounds: Normal breath sounds.  Abdominal:     General: There is no distension.     Palpations: Abdomen is soft.     Tenderness: There is no abdominal tenderness.  Musculoskeletal:        General: No deformity. Normal range of motion.     Cervical back: Normal range of motion and neck supple.  Skin:    General: Skin is warm and dry.  Neurological:     General: No focal deficit present.     Mental Status: He is alert and oriented to person, place, and time. Mental status is at baseline.  Psychiatric:     Comments: Pleasant, cooperative, denies SI or  HI     ED Results / Procedures / Treatments   Labs (all labs ordered are listed, but only abnormal results are displayed) Labs Reviewed  COMPREHENSIVE METABOLIC PANEL - Abnormal; Notable for the following components:      Result Value   Glucose, Bld 126 (*)    AST 57 (*)    ALT 72 (*)    All other components within normal limits  LIPASE, BLOOD  CBC    EKG None  Radiology No results found.  Procedures Procedures (including critical care time)  Medications Ordered in ED Medications  sodium chloride flush (NS) 0.9 % injection 3 mL (has no administration in time range)    ED Course  I have reviewed the triage vital signs and the nursing notes.  Pertinent labs & imaging results that were available during my care of the patient were reviewed by me and considered in my medical decision making (see chart for details).    MDM Rules/Calculators/A&P                      MDM  Screen complete  BOOMER DODARO was evaluated in Emergency Department on 06/21/2019 for the symptoms described in the history of present illness. He was evaluated in the context of the global COVID-19 pandemic, which necessitated consideration that the patient might be at risk for infection with the SARS-CoV-2 virus that causes COVID-19. Institutional protocols and algorithms that pertain to  the evaluation of patients at risk for COVID-19 are in a state of rapid change based on information released by regulatory bodies including the CDC and federal and state organizations. These policies and algorithms were followed during the patient's care in the ED.   Patient is presenting for evaluation with reported possible withdrawal type symptoms after stopping tramadol use.  Patient is well-appearing.  His symptoms are primarily difficulty sleeping at night and mild nausea.  Screening labs obtained are without significant abnormality.  Patient is encouraged to closely follow-up with his regular care providers.  Patient will be prescribed Zofran for nausea.  Benadryl was suggested as a possible sleep aid.  Strict return precautions given and understood. Final Clinical Impression(s) / ED Diagnoses Final diagnoses:  Nausea    Rx / DC Orders ED Discharge Orders         Ordered    ondansetron (ZOFRAN) 4 MG tablet  Every 6 hours     06/21/19 1946           Valarie Merino, MD 06/21/19 1949

## 2019-06-22 ENCOUNTER — Encounter: Payer: Self-pay | Admitting: Family Medicine

## 2019-06-22 ENCOUNTER — Ambulatory Visit (INDEPENDENT_AMBULATORY_CARE_PROVIDER_SITE_OTHER): Payer: BC Managed Care – PPO | Admitting: Family Medicine

## 2019-06-22 VITALS — BP 120/74 | HR 76 | Temp 97.5°F | Wt 287.8 lb

## 2019-06-22 DIAGNOSIS — M2550 Pain in unspecified joint: Secondary | ICD-10-CM | POA: Diagnosis not present

## 2019-06-22 DIAGNOSIS — F419 Anxiety disorder, unspecified: Secondary | ICD-10-CM | POA: Diagnosis not present

## 2019-06-22 DIAGNOSIS — M75100 Unspecified rotator cuff tear or rupture of unspecified shoulder, not specified as traumatic: Secondary | ICD-10-CM

## 2019-06-22 DIAGNOSIS — M12819 Other specific arthropathies, not elsewhere classified, unspecified shoulder: Secondary | ICD-10-CM | POA: Diagnosis not present

## 2019-06-22 MED ORDER — ALPRAZOLAM 0.25 MG PO TABS: 0.2500 mg | ORAL_TABLET | Freq: Two times a day (BID) | ORAL | 0 refills | Status: DC | PRN

## 2019-06-22 NOTE — Patient Instructions (Signed)
For start with 2 Tylenol 4 times per day and then you may add 800 mg of ibuprofen 3 times per day.  Be aware of the fact that the ibuprofen can cause stomach irritation and kidney issues.  Make sure you touch bases with Dr. Nelva Bush.

## 2019-06-22 NOTE — Progress Notes (Signed)
   Subjective:    Patient ID: Gerald Larsen, male    DOB: 1966-02-15, 54 y.o.   MRN: [id number]  HPI He is here for a visit after recent emergency room visit due to abruptly stopping his tramadol.  He was taking the tramadol given to him by Dr. Nelva Bush to help with bilateral shoulder and knee pain.  He has had previous injections of his knees for arthritis as well as difficulty with shoulder pain requiring procedures on them as well.  He was taking 3 tramadol per day and slowly increase it to 5 pills/day and was getting concerned over becoming addicted.  He then abruptly stopped the medication and had difficulty with anxiety and headaches and was seen in the emergency room.  He does not want to start back on the medications.  He is also quite anxious over all of this.   Review of Systems     Objective:   Physical Exam Alert and in no distress but slightly tearful.      Assessment & Plan:  Anxiety - Plan: ALPRAZolam (XANAX) 0.25 MG tablet  Arthralgia, unspecified joint  Rotator cuff tear arthropathy, unspecified laterality I discussed the arthritis symptoms in his shoulders and knees in regard to basic therapy including regular doses of Tylenol and NSAID of choice and then potentially going to using tramadol.  He is not interested in using tramadol.  I then discussed other options including chiropractic, acupuncture, massage, yoga.  He is also discussed his concerns with Dr. Nelva Bush.  He was also given a pamphlet for counseling at [facility] psychology.  Over 30 minutes spent discussing all these issues.

## 2019-07-04 ENCOUNTER — Other Ambulatory Visit: Payer: Self-pay | Admitting: Family Medicine

## 2019-07-04 DIAGNOSIS — E785 Hyperlipidemia, unspecified: Secondary | ICD-10-CM

## 2019-07-13 ENCOUNTER — Encounter: Payer: BC Managed Care – PPO | Admitting: Family Medicine

## 2019-07-20 ENCOUNTER — Ambulatory Visit: Payer: BC Managed Care – PPO | Admitting: Family Medicine

## 2019-07-20 ENCOUNTER — Other Ambulatory Visit: Payer: Self-pay

## 2019-07-20 ENCOUNTER — Telehealth: Payer: Self-pay | Admitting: Family Medicine

## 2019-07-20 ENCOUNTER — Encounter: Payer: Self-pay | Admitting: Family Medicine

## 2019-07-20 VITALS — BP 136/88 | HR 71 | Temp 98.2°F | Ht 71.0 in | Wt 289.6 lb

## 2019-07-20 DIAGNOSIS — F419 Anxiety disorder, unspecified: Secondary | ICD-10-CM

## 2019-07-20 DIAGNOSIS — E669 Obesity, unspecified: Secondary | ICD-10-CM | POA: Diagnosis not present

## 2019-07-20 DIAGNOSIS — E785 Hyperlipidemia, unspecified: Secondary | ICD-10-CM

## 2019-07-20 DIAGNOSIS — Z0000 Encounter for general adult medical examination without abnormal findings: Secondary | ICD-10-CM | POA: Diagnosis not present

## 2019-07-20 DIAGNOSIS — R7302 Impaired glucose tolerance (oral): Secondary | ICD-10-CM

## 2019-07-20 DIAGNOSIS — K219 Gastro-esophageal reflux disease without esophagitis: Secondary | ICD-10-CM

## 2019-07-20 DIAGNOSIS — I10 Essential (primary) hypertension: Secondary | ICD-10-CM

## 2019-07-20 LAB — POCT GLYCOSYLATED HEMOGLOBIN (HGB A1C): Hemoglobin A1C: 6.5 % — AB (ref 4.0–5.6)

## 2019-07-20 MED ORDER — ALPRAZOLAM 0.5 MG PO TABS: 0.5000 mg | ORAL_TABLET | Freq: Every evening | ORAL | 0 refills | Status: DC | PRN

## 2019-07-20 NOTE — Telephone Encounter (Signed)
Pt called and said he forgot to ask you about getting back on Norco because that works better for his migraines.

## 2019-07-20 NOTE — Progress Notes (Signed)
   Subjective:    Patient ID: Gerald Larsen, male    DOB: 08-Mar-1966, 54 y.o.   MRN: [id number]  HPI He is here for complete examination.  He continues have difficulty with anxiety and is contemplating getting involved in counseling for that.  He does use the Xanax but states that the present dosing is not as effective.  He does use this very sparingly.  He very much desires to get this under better control and is not interested in using medication long-term.  The anxiety has caused some erectile dysfunction however his marriage seems to be pretty stable.  He does have underlying arthritis and has had back trouble as well as knee pain.  He apparently scheduled for an MRI on his knees.  He is using tramadol for pain relief.  He does note that since he is started on diet and exercise program and has lost 20 pounds, his arthritis is causing much less difficulty.  He does have a previous history of glucose intolerance.  He does have reflux disease this seems to be under good control.  He is a former smoker. Family and social history as well as health maintenance and immunizations was reviewed.  Review of Systems  All other systems reviewed and are negative.      Objective:   Physical Exam Alert and in no distress. Tympanic membranes and canals are normal. Pharyngeal area is normal. Neck is supple without adenopathy or thyromegaly. Cardiac exam shows a regular sinus rhythm without murmurs or gallops. Lungs are clear to auscultation. Abdominal exam shows no masses or tenderness. Hemoglobin A1c is 6.5.     Assessment & Plan:  Routine general medical examination at a health care facility  Hyperlipidemia, unspecified hyperlipidemia type - Plan: Lipid panel  Obesity (BMI 30.0-34.9)  Gastroesophageal reflux disease without esophagitis  Anxiety - Plan: ALPRAZolam (XANAX) 0.5 MG tablet  Glucose intolerance (impaired glucose tolerance) - Plan: POCT glycosylated hemoglobin (Hb A1C)  Essential  hypertension Since he has lost 20 pounds and started an exercise program.  I will continue to label him as glucose intolerant and recheck this in about 3 months.  Encouraged him to continue with his diet and exercise regimen.  He will continue on his other medications.  I did increase his Xanax to the point 5.  He seems to be using this very sparingly.  He does plan to get involved in counseling which I strongly encouraged.

## 2019-07-20 NOTE — Telephone Encounter (Signed)
Have him set up a virtual visit so we can talk about that

## 2019-07-20 NOTE — Patient Instructions (Signed)
Start giving yourself positive thoughts and ideas.  What will you do and what will you think about

## 2019-07-21 LAB — LIPID PANEL
Chol/HDL Ratio: 3.1 ratio (ref 0.0–5.0)
Cholesterol, Total: 195 mg/dL (ref 100–199)
HDL: 62 mg/dL (ref >39)
LDL Chol Calc (NIH): 111 mg/dL — ABNORMAL HIGH (ref 0–99)
Triglycerides: 128 mg/dL (ref 0–149)
VLDL Cholesterol Cal: 22 mg/dL (ref 5–40)

## 2019-07-25 ENCOUNTER — Telehealth: Payer: BC Managed Care – PPO | Admitting: Family Medicine

## 2019-07-25 ENCOUNTER — Other Ambulatory Visit: Payer: Self-pay

## 2019-07-26 NOTE — Progress Notes (Signed)
0 0

## 2019-08-03 ENCOUNTER — Other Ambulatory Visit: Payer: Self-pay | Admitting: Family Medicine

## 2019-08-03 DIAGNOSIS — M25561 Pain in right knee: Secondary | ICD-10-CM | POA: Diagnosis not present

## 2019-08-03 DIAGNOSIS — E785 Hyperlipidemia, unspecified: Secondary | ICD-10-CM

## 2019-08-03 DIAGNOSIS — M25562 Pain in left knee: Secondary | ICD-10-CM | POA: Diagnosis not present

## 2019-08-16 DIAGNOSIS — M25561 Pain in right knee: Secondary | ICD-10-CM | POA: Diagnosis not present

## 2019-08-16 DIAGNOSIS — M25562 Pain in left knee: Secondary | ICD-10-CM | POA: Diagnosis not present

## 2019-08-17 DIAGNOSIS — M25561 Pain in right knee: Secondary | ICD-10-CM | POA: Diagnosis not present

## 2019-08-17 DIAGNOSIS — M25562 Pain in left knee: Secondary | ICD-10-CM | POA: Diagnosis not present

## 2019-08-22 ENCOUNTER — Other Ambulatory Visit: Payer: Self-pay | Admitting: Family Medicine

## 2019-08-22 DIAGNOSIS — E785 Hyperlipidemia, unspecified: Secondary | ICD-10-CM

## 2019-08-28 DIAGNOSIS — Z79891 Long term (current) use of opiate analgesic: Secondary | ICD-10-CM | POA: Diagnosis not present

## 2019-09-05 ENCOUNTER — Telehealth: Payer: Self-pay | Admitting: Family Medicine

## 2019-09-05 NOTE — Telephone Encounter (Signed)
Wife is on HIPAA and asked what therapist her husband saw in the past as she wants to schedule another visit regarding anxiety.  Pt saw Jackelyn Hoehn.  Pt wife advised.

## 2019-09-11 ENCOUNTER — Other Ambulatory Visit: Payer: Self-pay | Admitting: Family Medicine

## 2019-09-11 DIAGNOSIS — F419 Anxiety disorder, unspecified: Secondary | ICD-10-CM

## 2019-09-11 NOTE — Telephone Encounter (Signed)
[facility] is requesting to fill pt xanax. Please advise [facility] 

## 2019-10-09 ENCOUNTER — Ambulatory Visit: Payer: BC Managed Care – PPO | Admitting: Psychology

## 2019-10-09 ENCOUNTER — Telehealth: Payer: Self-pay | Admitting: Family Medicine

## 2019-10-09 DIAGNOSIS — F419 Anxiety disorder, unspecified: Secondary | ICD-10-CM

## 2019-10-09 MED ORDER — ALPRAZOLAM 0.5 MG PO TABS: 0.5000 mg | ORAL_TABLET | Freq: Every evening | ORAL | 0 refills | Status: DC | PRN

## 2019-10-09 NOTE — Telephone Encounter (Signed)
Pt called for refills of Xanax. He states that Farrel Demark is no longer in practice and he has been referred to another practice but does not have an appt until late next month. Please send refill to [facility]. Pt can be reached at [phone].

## 2019-10-12 ENCOUNTER — Ambulatory Visit: Payer: Self-pay | Admitting: Orthopedic Surgery

## 2019-10-19 ENCOUNTER — Encounter (HOSPITAL_COMMUNITY): Payer: Self-pay

## 2019-10-19 NOTE — Progress Notes (Addendum)
PCP - Dr. Jill Alexanders LOV 5-6 21 epic Cardiologist - no  PPM/ICD -  Device Orders -  Rep Notified -   Chest x-ray -  EKG - 10-23-19 Stress Test -  ECHO -  Cardiac Cath -  Hgba1c 7.0  Sleep Study -  CPAP -   Fasting Blood Sugar -  Checks Blood Sugar _____ times a day  Blood Thinner Instructions: Aspirin Instructions:  ERAS Protcol - PRE-SURGERY Ensure or G2-   COVID TEST- 10-21-19   Not vaccinated  Activity-Walks 10 miles a day at work climbs stairs at work also without SOB Anesthesia review: OSA  CPAP  HTN . Pre DM  Patient denies shortness of breath, fever, cough and chest pain at PAT appointment   All instructions explained to the patient, with a verbal understanding of the material. Patient agrees to go over the instructions while at home for a better understanding. Patient also instructed to self quarantine after being tested for COVID-19. The opportunity to ask questions was provided.

## 2019-10-19 NOTE — Patient Instructions (Addendum)
DUE TO COVID-19 ONLY ONE VISITOR IS ALLOWED TO COME WITH YOU AND STAY IN THE WAITING ROOM ONLY DURING PRE OP AND PROCEDURE DAY OF SURGERY. THE 1 VISITOR  MAY VISIT WITH YOU AFTER SURGERY IN YOUR PRIVATE ROOM DURING VISITING HOURS ONLY!  YOU NEED TO HAVE A COVID 19 TEST ON__8-7_____ @___10 :20 am____, THIS TEST MUST BE DONE BEFORE SURGERY,  COVID TESTING SITE [place] [address], IT IS ON THE RIGHT GOING OUT [address] APPROXIMATELY  2 MINUTES PAST [employer] ON THE RIGHT. ONCE YOUR COVID TEST IS COMPLETED,  PLEASE BEGIN THE QUARANTINE INSTRUCTIONS AS OUTLINED IN YOUR HANDOUT.                Gerald Larsen  10/19/2019   Your procedure is scheduled on: 10-25-19   Report to [facility] Main  Entrance   Report to admitting at      [place]  AM     Call this number if you have problems the morning of surgery [phone]    Remember:NO SOLID FOOD AFTER MIDNIGHT THE NIGHT PRIOR TO SURGERY. NOTHING BY MOUTH EXCEPT CLEAR LIQUIDS UNTIL  0530 am  . PLEASE FINISH G2  DRINK PER SURGEON ORDER  WHICH NEEDS TO BE COMPLETED AT       0530 am then nothing by mouth.     CLEAR LIQUID DIET   Foods Allowed                                                                                     Foods Excluded  Coffee and tea, regular and decaf   No Creamer                           liquids that you cannot  Plain Jell-O any favor except red or purple                                           see through such as: Fruit ices (not with fruit pulp)                                                              milk, soups, orange juice  Iced Popsicles                                                             All solid food Carbonated beverages, regular and diet  Cranberry, grape and apple juices Sports drinks like Gatorade Lightly seasoned clear broth or consume(fat free) Sugar, honey  syrup  _____________________________________________________________________     BRUSH YOUR TEETH MORNING OF SURGERY AND RINSE YOUR MOUTH OUT, NO CHEWING GUM CANDY OR MINTS.     Take these medicines the morning of surgery with A SIP OF WATER: prevastatin, oxycodone if needed, omeprazole                                 You may not have any metal on your body including hair pins and              piercings  Do not wear jewelry,  lotions, powders or perfumes, deodorant                       Men may shave face and neck.   Do not bring valuables to the hospital. [place].  Contacts, dentures or bridgework may not be worn into surgery.       Patients discharged the day of surgery will not be allowed to drive home. IF YOU ARE HAVING SURGERY AND GOING HOME THE SAME DAY, YOU MUST HAVE AN ADULT TO DRIVE YOU HOME AND BE WITH YOU FOR 24 HOURS. YOU MAY GO HOME BY TAXI OR UBER OR ORTHERWISE, BUT AN ADULT MUST ACCOMPANY YOU HOME AND STAY WITH YOU FOR 24 HOURS.  Name and phone number of your driver:  Special Instructions: N/A              Please read over the following fact sheets you were given: _____________________________________________________________________             [facility] - Preparing for Surgery Before surgery, you can play an important role.  Because skin is not sterile, your skin needs to be as free of germs as possible.  You can reduce the number of germs on your skin by washing with CHG (chlorahexidine gluconate) soap before surgery.  CHG is an antiseptic cleaner which kills germs and bonds with the skin to continue killing germs even after washing. Please DO NOT use if you have an allergy to CHG or antibacterial soaps.  If your skin becomes reddened/irritated stop using the CHG and inform your nurse when you arrive at Short Stay. Do not shave (including legs and underarms) for at least 48 hours prior to the first CHG shower.   You may shave your face/neck. Please follow these instructions carefully:  1.  Shower with CHG Soap the night before surgery and the  morning of Surgery.  2.  If you choose to wash your hair, wash your hair first as usual with your  normal  shampoo.  3.  After you shampoo, rinse your hair and body thoroughly to remove the  shampoo.                           4.  Use CHG as you would any other liquid soap.  You can apply chg directly  to the skin and wash                       Gently with a scrungie or clean washcloth.  5.  Apply the CHG Soap to your  body ONLY FROM THE NECK DOWN.   Do not use on face/ open                           Wound or open sores. Avoid contact with eyes, ears mouth and genitals (private parts).                       Wash face,  Genitals (private parts) with your normal soap.             6.  Wash thoroughly, paying special attention to the area where your surgery  will be performed.  7.  Thoroughly rinse your body with warm water from the neck down.  8.  DO NOT shower/wash with your normal soap after using and rinsing off  the CHG Soap.                9.  Pat yourself dry with a clean towel.            10.  Wear clean pajamas.            11.  Place clean sheets on your bed the night of your first shower and do not  sleep with pets. Day of Surgery : Do not apply any lotions/deodorants the morning of surgery.  Please wear clean clothes to the hospital/surgery center.  FAILURE TO FOLLOW THESE INSTRUCTIONS MAY RESULT IN THE CANCELLATION OF YOUR SURGERY PATIENT SIGNATURE_________________________________  NURSE SIGNATURE__________________________________  ________________________________________________________________________   Gerald Larsen  An incentive spirometer is a tool that can help keep your lungs clear and active. This tool measures how well you are filling your lungs with each breath. Taking long deep breaths may help reverse or decrease the chance of  developing breathing (pulmonary) problems (especially infection) following:  A long period of time when you are unable to move or be active. BEFORE THE PROCEDURE   If the spirometer includes an indicator to show your best effort, your nurse or respiratory therapist will set it to a desired goal.  If possible, sit up straight or lean slightly forward. Try not to slouch.  Hold the incentive spirometer in an upright position. INSTRUCTIONS FOR USE  1. Sit on the edge of your bed if possible, or sit up as far as you can in bed or on a chair. 2. Hold the incentive spirometer in an upright position. 3. Breathe out normally. 4. Place the mouthpiece in your mouth and seal your lips tightly around it. 5. Breathe in slowly and as deeply as possible, raising the piston or the ball toward the top of the column. 6. Hold your breath for 3-5 seconds or for as long as possible. Allow the piston or ball to fall to the bottom of the column. 7. Remove the mouthpiece from your mouth and breathe out normally. 8. Rest for a few seconds and repeat Steps 1 through 7 at least 10 times every 1-2 hours when you are awake. Take your time and take a few normal breaths between deep breaths. 9. The spirometer may include an indicator to show your best effort. Use the indicator as a goal to work toward during each repetition. 10. After each set of 10 deep breaths, practice coughing to be sure your lungs are clear. If you have an incision (the cut made at the time of surgery), support your incision when coughing by placing a pillow or rolled up towels firmly against it.  Once you are able to get out of bed, walk around indoors and cough well. You may stop using the incentive spirometer when instructed by your caregiver.  RISKS AND COMPLICATIONS  Take your time so you do not get dizzy or light-headed.  If you are in pain, you may need to take or ask for pain medication before doing incentive spirometry. It is harder to take a  deep breath if you are having pain. AFTER USE  Rest and breathe slowly and easily.  It can be helpful to keep track of a log of your progress. Your caregiver can provide you with a simple table to help with this. If you are using the spirometer at home, follow these instructions: [place] IF:   You are having difficultly using the spirometer.  You have trouble using the spirometer as often as instructed.  Your pain medication is not giving enough relief while using the spirometer.  You develop fever of 100.5 F (38.1 C) or higher. SEEK IMMEDIATE MEDICAL CARE IF:   You cough up bloody sputum that had not been present before.  You develop fever of 102 F (38.9 C) or greater.  You develop worsening pain at or near the incision site. MAKE SURE YOU:   Understand these instructions.  Will watch your condition.  Will get help right away if you are not doing well or get worse. Document Released: 07/13/2006 Document Revised: 05/25/2011 Document Reviewed: 09/13/2006 [facility] Patient Information 2014 [place], [place].   ________________________________________________________________________

## 2019-10-19 NOTE — H&P (View-Only) (Signed)
PCP - Dr. Jill Alexanders LOV 5-6 21 epic Cardiologist - no  PPM/ICD -  Device Orders -  Rep Notified -   Chest x-ray -  EKG - 10-23-19 Stress Test -  ECHO -  Cardiac Cath -  Hgba1c 7.0  Sleep Study -  CPAP -   Fasting Blood Sugar -  Checks Blood Sugar _____ times a day  Blood Thinner Instructions: Aspirin Instructions:  ERAS Protcol - PRE-SURGERY Ensure or G2-   COVID TEST- 10-21-19   Not vaccinated  Activity-Walks 10 miles a day at work climbs stairs at work also without SOB Anesthesia review: OSA  CPAP  HTN . Pre DM  Patient denies shortness of breath, fever, cough and chest pain at PAT appointment   All instructions explained to the patient, with a verbal understanding of the material. Patient agrees to go over the instructions while at home for a better understanding. Patient also instructed to self quarantine after being tested for COVID-19. The opportunity to ask questions was provided.

## 2019-10-21 ENCOUNTER — Other Ambulatory Visit (HOSPITAL_COMMUNITY)
Admission: RE | Admit: 2019-10-21 | Discharge: 2019-10-21 | Disposition: A | Discharge status: Home / Self Care | Payer: BC Managed Care – PPO | Source: Ambulatory Visit | Attending: Specialist | Admitting: Specialist

## 2019-10-21 DIAGNOSIS — Z01812 Encounter for preprocedural laboratory examination: Secondary | ICD-10-CM | POA: Insufficient documentation

## 2019-10-21 DIAGNOSIS — Z20822 Contact with and (suspected) exposure to covid-19: Secondary | ICD-10-CM | POA: Diagnosis not present

## 2019-10-21 LAB — SARS CORONAVIRUS 2 (TAT 6-24 HRS): SARS Coronavirus 2: NEGATIVE

## 2019-10-23 ENCOUNTER — Encounter (HOSPITAL_COMMUNITY): Payer: Self-pay

## 2019-10-23 ENCOUNTER — Encounter (HOSPITAL_COMMUNITY)
Admission: RE | Admit: 2019-10-23 | Discharge: 2019-10-23 | Disposition: A | Discharge status: Home / Self Care | Payer: BC Managed Care – PPO | Source: Ambulatory Visit | Attending: Specialist | Admitting: Specialist

## 2019-10-23 ENCOUNTER — Other Ambulatory Visit: Payer: Self-pay

## 2019-10-23 DIAGNOSIS — Z01810 Encounter for preprocedural cardiovascular examination: Secondary | ICD-10-CM | POA: Diagnosis not present

## 2019-10-23 DIAGNOSIS — Z01812 Encounter for preprocedural laboratory examination: Secondary | ICD-10-CM | POA: Insufficient documentation

## 2019-10-23 HISTORY — DX: Prediabetes: R73.03

## 2019-10-23 HISTORY — DX: Other disorders of intestinal carbohydrate absorption: E74.39

## 2019-10-23 LAB — HEMOGLOBIN A1C
Hgb A1c MFr Bld: 7 % — ABNORMAL HIGH (ref 4.8–5.6)
Mean Plasma Glucose: 154.2 mg/dL

## 2019-10-23 LAB — BASIC METABOLIC PANEL
Anion gap: 10 (ref 5–15)
BUN: 14 mg/dL (ref 6–20)
CO2: 27 mmol/L (ref 22–32)
Calcium: 9.4 mg/dL (ref 8.9–10.3)
Chloride: 102 mmol/L (ref 98–111)
Creatinine, Ser: 0.97 mg/dL (ref 0.61–1.24)
GFR calc Af Amer: >60 mL/min (ref >60)
GFR calc non Af Amer: >60 mL/min (ref >60)
Glucose, Bld: 139 mg/dL — ABNORMAL HIGH (ref 70–99)
Potassium: 4.2 mmol/L (ref 3.5–5.1)
Sodium: 139 mmol/L (ref 135–145)

## 2019-10-23 LAB — CBC
HCT: 46.5 % (ref 39.0–52.0)
Hemoglobin: 15.4 g/dL (ref 13.0–17.0)
MCH: 29.7 pg (ref 26.0–34.0)
MCHC: 33.1 g/dL (ref 30.0–36.0)
MCV: 89.8 fL (ref 80.0–100.0)
Platelets: 229 10*3/uL (ref 150–400)
RBC: 5.18 MIL/uL (ref 4.22–5.81)
RDW: 14.4 % (ref 11.5–15.5)
WBC: 7.2 10*3/uL (ref 4.0–10.5)
nRBC: 0 % (ref 0.0–0.2)

## 2019-10-24 MED ORDER — DEXTROSE 5 % IV SOLN
3.0000 g | INTRAVENOUS | Status: AC
  Administered 2019-10-25: 3 g via INTRAVENOUS
  Filled 2019-10-24: qty 3

## 2019-10-24 NOTE — Anesthesia Preprocedure Evaluation (Addendum)
Anesthesia Evaluation  Patient identified by MRN, date of birth, ID band Patient awake    Reviewed: Allergy & Precautions, H&P , NPO status , Patient's Chart, lab work & pertinent test results  Airway Mallampati: III  TM Distance: >3 FB Neck ROM: Full    Dental  (+) Dental Advisory Given, Teeth Intact   Pulmonary sleep apnea , former smoker,    Pulmonary exam normal breath sounds clear to auscultation       Cardiovascular hypertension, Pt. on medications Normal cardiovascular exam Rhythm:Regular Rate:Normal     Neuro/Psych  Headaches, PSYCHIATRIC DISORDERS Anxiety    GI/Hepatic Neg liver ROS, GERD  ,  Endo/Other  diabetesMorbid obesity  Renal/GU negative Renal ROS  negative genitourinary   Musculoskeletal  (+) Arthritis ,   Abdominal (+) + obese,   Peds negative pediatric ROS (+)  Hematology negative hematology ROS (+)   Anesthesia Other Findings   Reproductive/Obstetrics                            Anesthesia Physical  Anesthesia Plan  ASA: III  Anesthesia Plan: General   Post-op Pain Management:    Induction: Intravenous  PONV Risk Score and Plan: 3 and Ondansetron, Dexamethasone, Treatment may vary due to age or medical condition and Midazolam  Airway Management Planned: LMA  Additional Equipment: None  Intra-op Plan:   Post-operative Plan: Extubation in OR  Informed Consent: I have reviewed the patients History and Physical, chart, labs and discussed the procedure including the risks, benefits and alternatives for the proposed anesthesia with the patient or authorized representative who has indicated his/her understanding and acceptance.     Dental advisory given  Plan Discussed with: CRNA  Anesthesia Plan Comments:        Anesthesia Quick Evaluation

## 2019-10-25 ENCOUNTER — Ambulatory Visit (HOSPITAL_COMMUNITY)
Admission: RE | Admit: 2019-10-25 | Discharge: 2019-10-25 | Disposition: A | Discharge status: Home / Self Care | Payer: BC Managed Care – PPO | Source: Ambulatory Visit | Attending: Specialist | Admitting: Specialist

## 2019-10-25 ENCOUNTER — Ambulatory Visit (HOSPITAL_COMMUNITY): Payer: BC Managed Care – PPO | Admitting: Certified Registered Nurse Anesthetist

## 2019-10-25 ENCOUNTER — Encounter (HOSPITAL_COMMUNITY)
Admission: RE | Disposition: A | Discharge status: Home / Self Care | Payer: Self-pay | Source: Ambulatory Visit | Attending: Specialist

## 2019-10-25 DIAGNOSIS — S83511A Sprain of anterior cruciate ligament of right knee, initial encounter: Secondary | ICD-10-CM | POA: Diagnosis not present

## 2019-10-25 DIAGNOSIS — M1711 Unilateral primary osteoarthritis, right knee: Secondary | ICD-10-CM | POA: Insufficient documentation

## 2019-10-25 DIAGNOSIS — S83281A Other tear of lateral meniscus, current injury, right knee, initial encounter: Secondary | ICD-10-CM | POA: Diagnosis not present

## 2019-10-25 DIAGNOSIS — G43909 Migraine, unspecified, not intractable, without status migrainosus: Secondary | ICD-10-CM | POA: Diagnosis not present

## 2019-10-25 DIAGNOSIS — I10 Essential (primary) hypertension: Secondary | ICD-10-CM | POA: Insufficient documentation

## 2019-10-25 DIAGNOSIS — G4733 Obstructive sleep apnea (adult) (pediatric): Secondary | ICD-10-CM | POA: Diagnosis not present

## 2019-10-25 DIAGNOSIS — S83241A Other tear of medial meniscus, current injury, right knee, initial encounter: Secondary | ICD-10-CM | POA: Diagnosis not present

## 2019-10-25 DIAGNOSIS — Z87891 Personal history of nicotine dependence: Secondary | ICD-10-CM | POA: Insufficient documentation

## 2019-10-25 DIAGNOSIS — S83231A Complex tear of medial meniscus, current injury, right knee, initial encounter: Secondary | ICD-10-CM | POA: Diagnosis not present

## 2019-10-25 DIAGNOSIS — Z6839 Body mass index (BMI) 39.0-39.9, adult: Secondary | ICD-10-CM | POA: Insufficient documentation

## 2019-10-25 DIAGNOSIS — R7303 Prediabetes: Secondary | ICD-10-CM | POA: Insufficient documentation

## 2019-10-25 DIAGNOSIS — X58XXXA Exposure to other specified factors, initial encounter: Secondary | ICD-10-CM | POA: Diagnosis not present

## 2019-10-25 HISTORY — PX: KNEE ARTHROSCOPY: SHX127

## 2019-10-25 SURGERY — ARTHROSCOPY, KNEE
Anesthesia: General | Site: Knee | Laterality: Right

## 2019-10-25 MED ORDER — LACTATED RINGERS IV SOLN: INTRAVENOUS | Status: DC

## 2019-10-25 MED ORDER — LIDOCAINE 2% (20 MG/ML) 5 ML SYRINGE
INTRAMUSCULAR | Status: DC | PRN
  Administered 2019-10-25: 100 mg via INTRAVENOUS

## 2019-10-25 MED ORDER — ORAL CARE MOUTH RINSE: 15.0000 mL | Freq: Once | OROMUCOSAL | Status: AC

## 2019-10-25 MED ORDER — ONDANSETRON HCL 4 MG/2ML IJ SOLN
INTRAMUSCULAR | Status: AC
  Filled 2019-10-25: qty 2

## 2019-10-25 MED ORDER — MIDAZOLAM HCL 5 MG/5ML IJ SOLN
INTRAMUSCULAR | Status: DC | PRN
  Administered 2019-10-25: 2 mg via INTRAVENOUS

## 2019-10-25 MED ORDER — PROPOFOL 10 MG/ML IV BOLUS
INTRAVENOUS | Status: AC
  Filled 2019-10-25: qty 20

## 2019-10-25 MED ORDER — ONDANSETRON HCL 4 MG/2ML IJ SOLN
INTRAMUSCULAR | Status: DC | PRN
  Administered 2019-10-25: 4 mg via INTRAVENOUS

## 2019-10-25 MED ORDER — EPINEPHRINE PF 1 MG/ML IJ SOLN
INTRAMUSCULAR | Status: DC | PRN
  Administered 2019-10-25: 1 mg

## 2019-10-25 MED ORDER — PHENYLEPHRINE 40 MCG/ML (10ML) SYRINGE FOR IV PUSH (FOR BLOOD PRESSURE SUPPORT)
PREFILLED_SYRINGE | INTRAVENOUS | Status: DC | PRN
  Administered 2019-10-25: 40 ug via INTRAVENOUS
  Administered 2019-10-25 (×2): 80 ug via INTRAVENOUS
  Administered 2019-10-25: 40 ug via INTRAVENOUS
  Administered 2019-10-25 (×2): 80 ug via INTRAVENOUS

## 2019-10-25 MED ORDER — LIDOCAINE 2% (20 MG/ML) 5 ML SYRINGE
INTRAMUSCULAR | Status: AC
  Filled 2019-10-25: qty 5

## 2019-10-25 MED ORDER — FENTANYL CITRATE (PF) 100 MCG/2ML IJ SOLN
INTRAMUSCULAR | Status: AC
  Filled 2019-10-25: qty 2

## 2019-10-25 MED ORDER — CHLORHEXIDINE GLUCONATE 0.12 % MT SOLN
15.0000 mL | Freq: Once | OROMUCOSAL | Status: AC
  Administered 2019-10-25: 15 mL via OROMUCOSAL

## 2019-10-25 MED ORDER — DEXAMETHASONE SODIUM PHOSPHATE 10 MG/ML IJ SOLN
INTRAMUSCULAR | Status: DC | PRN
Start: 2019-10-25 — End: 2019-10-25
  Administered 2019-10-25: 8 mg via INTRAVENOUS

## 2019-10-25 MED ORDER — MIDAZOLAM HCL 2 MG/2ML IJ SOLN
INTRAMUSCULAR | Status: AC
  Filled 2019-10-25: qty 2

## 2019-10-25 MED ORDER — OXYCODONE HCL 5 MG/5ML PO SOLN: 5.0000 mg | Freq: Once | ORAL | Status: DC | PRN

## 2019-10-25 MED ORDER — SODIUM CHLORIDE 0.9 % IR SOLN
Status: DC | PRN
  Administered 2019-10-25: 6000 mL

## 2019-10-25 MED ORDER — BUPIVACAINE-EPINEPHRINE 0.5% -1:200000 IJ SOLN
INTRAMUSCULAR | Status: DC | PRN
  Administered 2019-10-25: 20 mL

## 2019-10-25 MED ORDER — PROPOFOL 10 MG/ML IV BOLUS
INTRAVENOUS | Status: DC | PRN
  Administered 2019-10-25: 200 mg via INTRAVENOUS

## 2019-10-25 MED ORDER — EPINEPHRINE PF 1 MG/ML IJ SOLN
INTRAMUSCULAR | Status: AC
  Filled 2019-10-25: qty 1

## 2019-10-25 MED ORDER — BUPIVACAINE-EPINEPHRINE (PF) 0.5% -1:200000 IJ SOLN
INTRAMUSCULAR | Status: AC
  Filled 2019-10-25: qty 30

## 2019-10-25 MED ORDER — PROMETHAZINE HCL 25 MG/ML IJ SOLN: 6.2500 mg | INTRAMUSCULAR | Status: DC | PRN

## 2019-10-25 MED ORDER — MEPERIDINE HCL 50 MG/ML IJ SOLN: 6.2500 mg | INTRAMUSCULAR | Status: DC | PRN

## 2019-10-25 MED ORDER — PHENYLEPHRINE 40 MCG/ML (10ML) SYRINGE FOR IV PUSH (FOR BLOOD PRESSURE SUPPORT)
PREFILLED_SYRINGE | INTRAVENOUS | Status: AC
  Filled 2019-10-25: qty 20

## 2019-10-25 MED ORDER — DEXAMETHASONE SODIUM PHOSPHATE 10 MG/ML IJ SOLN
INTRAMUSCULAR | Status: AC
  Filled 2019-10-25: qty 1

## 2019-10-25 MED ORDER — FENTANYL CITRATE (PF) 100 MCG/2ML IJ SOLN
INTRAMUSCULAR | Status: DC | PRN
  Administered 2019-10-25 (×3): 50 ug via INTRAVENOUS

## 2019-10-25 MED ORDER — OXYCODONE HCL 5 MG PO TABS: 5.0000 mg | ORAL_TABLET | Freq: Once | ORAL | Status: DC | PRN

## 2019-10-25 MED ORDER — EPHEDRINE SULFATE-NACL 50-0.9 MG/10ML-% IV SOSY
PREFILLED_SYRINGE | INTRAVENOUS | Status: DC | PRN
  Administered 2019-10-25: 20 mg via INTRAVENOUS
  Administered 2019-10-25: 5 mg via INTRAVENOUS
  Administered 2019-10-25: 15 mg via INTRAVENOUS

## 2019-10-25 MED ORDER — HYDROMORPHONE HCL 1 MG/ML IJ SOLN: 0.2500 mg | INTRAMUSCULAR | Status: DC | PRN

## 2019-10-25 MED ORDER — OXYCODONE-ACETAMINOPHEN 10-325 MG PO TABS: 1.0000 | ORAL_TABLET | ORAL | 0 refills | Status: AC | PRN

## 2019-10-25 MED ORDER — EPHEDRINE 5 MG/ML INJ
INTRAVENOUS | Status: AC
  Filled 2019-10-25: qty 10

## 2019-10-25 SURGICAL SUPPLY — 36 items
BLADE SURG SZ11 CARB STEEL (BLADE) IMPLANT
BNDG ELASTIC 6X5.8 VLCR STR LF (GAUZE/BANDAGES/DRESSINGS) ×2 IMPLANT
BOOTIES KNEE HIGH SLOAN (MISCELLANEOUS) ×4 IMPLANT
CANNULA ACUFLEX KIT 5X76 (CANNULA) ×2 IMPLANT
COVER SURGICAL LIGHT HANDLE (MISCELLANEOUS) ×2 IMPLANT
COVER WAND RF STERILE (DRAPES) IMPLANT
DISSECTOR 3.5MM X 13CM (MISCELLANEOUS) ×2 IMPLANT
DISSECTOR 3.5MM X 13CM CVD (MISCELLANEOUS) ×2 IMPLANT
DISSECTOR 4.0MMX13CM CVD (MISCELLANEOUS) IMPLANT
DRSG EMULSION OIL 3X3 NADH (GAUZE/BANDAGES/DRESSINGS) ×2 IMPLANT
DRSG PAD ABDOMINAL 8X10 ST (GAUZE/BANDAGES/DRESSINGS) ×2 IMPLANT
DURAPREP 26ML APPLICATOR (WOUND CARE) ×2 IMPLANT
ELECT MENISCUS 165MM 90D (ELECTRODE) IMPLANT
GAUZE SPONGE 4X4 12PLY STRL (GAUZE/BANDAGES/DRESSINGS) ×2 IMPLANT
GLOVE BIOGEL PI IND STRL 7.5 (GLOVE) ×1 IMPLANT
GLOVE BIOGEL PI IND STRL 8 (GLOVE) ×1 IMPLANT
GLOVE BIOGEL PI INDICATOR 7.5 (GLOVE) ×1
GLOVE BIOGEL PI INDICATOR 8 (GLOVE) ×1
GLOVE SURG SS PI 7.5 STRL IVOR (GLOVE) ×4 IMPLANT
GLOVE SURG SS PI 8.0 STRL IVOR (GLOVE) ×4 IMPLANT
GOWN STRL REUS W/TWL LRG LVL3 (GOWN DISPOSABLE) ×2 IMPLANT
GOWN STRL REUS W/TWL XL LVL3 (GOWN DISPOSABLE) ×4 IMPLANT
KIT BASIN OR (CUSTOM PROCEDURE TRAY) IMPLANT
KIT TURNOVER KIT A (KITS) IMPLANT
MANIFOLD NEPTUNE II (INSTRUMENTS) ×4 IMPLANT
PACK ARTHROSCOPY WL (CUSTOM PROCEDURE TRAY) ×2 IMPLANT
PADDING CAST COTTON 6X4 STRL (CAST SUPPLIES) ×2 IMPLANT
PENCIL SMOKE EVACUATOR (MISCELLANEOUS) IMPLANT
PORT APPOLLO RF 90DEGREE MULTI (SURGICAL WAND) IMPLANT
PROBE BIPOLAR ATHRO 135MM 90D (MISCELLANEOUS) IMPLANT
SUT ETHILON 4 0 PS 2 18 (SUTURE) ×2 IMPLANT
TOWEL OR 17X26 10 PK STRL BLUE (TOWEL DISPOSABLE) ×2 IMPLANT
TUBING ARTHROSCOPY IRRIG 16FT (MISCELLANEOUS) ×2 IMPLANT
WAND APOLLORF SJ50 AR-9845 (SURGICAL WAND) ×2 IMPLANT
WIPE CHG CHLORHEXIDINE 2% (PERSONAL CARE ITEMS) ×2 IMPLANT
WRAP KNEE MAXI GEL POST OP (GAUZE/BANDAGES/DRESSINGS) ×2 IMPLANT

## 2019-10-25 NOTE — Transfer of Care (Signed)
Immediate Anesthesia Transfer of Care Note  Patient: Gerald Larsen  Procedure(s) Performed: ARTHROSCOPY KNEE AND DEBRIDEMENT (Right Knee)  Patient Location: PACU  Anesthesia Type:General  Level of Consciousness: drowsy and patient cooperative  Airway & Oxygen Therapy: Patient Spontanous Breathing and Patient connected to face mask oxygen  Post-op Assessment: Report given to RN and Post -op Vital signs reviewed and stable  Post vital signs: Reviewed and stable  Last Vitals:  Vitals Value Taken Time  BP 99/59 10/25/19 0938  Temp    Pulse 64 10/25/19 0941  Resp 17 10/25/19 0941  SpO2 91 % 10/25/19 0941  Vitals shown include unvalidated device data.  Last Pain:  Vitals:   10/25/19 0651  TempSrc: Oral         Complications: No complications documented.

## 2019-10-25 NOTE — Op Note (Signed)
NAME: Gerald Larsen, [place] JS:[id number] ACCOUNT [id number] DATE OF BIRTH:Jun 13, 1965 FACILITY: [facility] LOCATION: [facility] PHYSICIAN:Hezikiah Retzloff Windy Kalata, MD  OPERATIVE REPORT  DATE OF PROCEDURE:  10/25/2019  PREOPERATIVE DIAGNOSIS:  Degenerative joint disease, medial meniscus tear of the right knee.  POSTOPERATIVE DIAGNOSIS:  Degenerative joint disease, medial meniscus tear of the right knee, small tear of the anterior crucial ligament, radial tearing of lateral meniscus.  PROCEDURE PERFORMED: 1.  Right knee arthroscopy. 2.  Partial medial meniscectomy. 3.  Chondroplasty medial femoral condyle and medial tibial plateau. 4.  Shaving the lateral meniscus. 5.  Debridement of partial tear of the ACL. 6.  Chondroplasty of the patella.  ANESTHESIA:  General.  ASSISTANT:  Lacie Draft, PA  HISTORY:  A 54 year old with right knee pain, locking and giving way.  Refractory to conservative treatment.  MRI indicating meniscus tear, both medially and laterally, predominantly medially.  With mechanical symptoms he was indicated for knee  arthroscopy, partial meniscectomy.  Risks and benefits discussed including bleeding, infection, damage to neurovascular structures, no change in symptoms, worsening symptoms, DVT, PE, anesthetic complications, etc.  DESCRIPTION OF PROCEDURE:  With the patient in supine position after induction of adequate general anesthesia, 3 grams Kefzol to the right lower extremity was prepped and draped in the usual sterile fashion.  Timeout performed.  A lateral parapatellar  portal was fashioned with a #11 blade and gross cannula atraumatically placed.  Irrigant was utilized to insufflate the joint.  Under direct visualization, a medial parapatellar portal was fashioned with a #11 blade after localization with an 18-gauge  needle sparing the medial meniscus.  Noted were grade III changes of the weightbearing surface of the medial femoral condyle and of the tibial  plateau.  No grade IV changes.  Complex tearing of the posterior half of the medial meniscus with a large  section of that macerated.  I introduced a combination of a small upbiting basket, a shaver and an ArthroWand to perform partial medial meniscectomy of the remaining fragments which were displaced into the joint, 1 posteriorly, and 1 between the junction  of the middle and posterior 1/3 of the meniscus.  This was back to near the capsule.  Cauterized that portion of that and we contoured the junction between the anterior and the middle 1/3 with a shaver.  I then probed that; it did not displace into the  joint, nor posteriorly.  There were no grade IV changes.  Next, we examined the ACL, there was a small tear in the lateral bundle.  About 10% of the ACL.  This was debrided.  He had a negative anterior drawer.  Lateral compartment was fairly tight.  We were able to gain access with additional relaxation by  anesthesia.  No significant chondral changes of the femoral condyle or tibial plateau.  He had some small radial and longitudinal tearing of the lateral meniscus and this was shaved to a stable base, and it was probed and was stable to probe palpation.  Suprapatellar pouch revealed grade III changes, minor of the central portion of the patella.  Chondroplasty was performed here.  There was normal patellofemoral tracking.  Gutters unremarkable.  We revisited all compartments.  No further pathology amenable to arthroscopic intervention.  I therefore removed all instrumentation.  Portals were closed with 4-0 nylon simple sutures.  Marcaine 0.25% with epinephrine was infiltrated in the joint.   Wound was dressed sterilely.  Awoken without difficulty and transported to the recovery room in satisfactory condition.  The  patient tolerated the procedure well.  No complications.  Assistant Lacie Draft PA was used for extremity positioning due to the patient's size.  CN/NUANCE  D:10/25/2019  T:10/25/2019 JOB:[id number]

## 2019-10-25 NOTE — Brief Op Note (Signed)
10/25/2019  9:25 AM  PATIENT:  Gerald Larsen  54 y.o. male  PRE-OPERATIVE DIAGNOSIS:  Right knee meniscal tear  POST-OPERATIVE DIAGNOSIS:  Right knee meniscal tear  PROCEDURE:  Procedure(s): ARTHROSCOPY KNEE AND DEBRIDEMENT (Right)  SURGEON:  Surgeon(s) and Role:    Susa Day, MD - Primary  PHYSICIAN ASSISTANT:   ASSISTANTS: Bissell   ANESTHESIA:   general  EBL:  min   BLOOD ADMINISTERED:none  DRAINS: none   LOCAL MEDICATIONS USED:  MARCAINE     SPECIMEN:  No Specimen  DISPOSITION OF SPECIMEN:  N/A  COUNTS:  YES  TOURNIQUET:  * No tourniquets in log *  DICTATION: .Other Dictation: Dictation Number [id number]  PLAN OF CARE: Discharge to home after PACU  PATIENT DISPOSITION:  PACU - hemodynamically stable.   Delay start of Pharmacological VTE agent (>24hrs) due to surgical blood loss or risk of bleeding: no

## 2019-10-25 NOTE — Interval H&P Note (Signed)
History and Physical Interval Note:  10/25/2019 8:10 AM  Gerald Larsen  has presented today for surgery, with the diagnosis of Right knee meniscal tear.  The various methods of treatment have been discussed with the patient and family. After consideration of risks, benefits and other options for treatment, the patient has consented to  Procedure(s): ARTHROSCOPY KNEE AND DEBRIDEMENT (Right) as a surgical intervention.  The patient's history has been reviewed, patient examined, no change in status, stable for surgery.  I have reviewed the patient's chart and labs.  Questions were answered to the patient's satisfaction.     Johnn Hai

## 2019-10-25 NOTE — Anesthesia Postprocedure Evaluation (Signed)
Anesthesia Post Note  Patient: Gerald Larsen  Procedure(s) Performed: ARTHROSCOPY KNEE AND DEBRIDEMENT (Right Knee)     Patient location during evaluation: PACU Anesthesia Type: General Level of consciousness: sedated and patient cooperative Pain management: pain level controlled Vital Signs Assessment: post-procedure vital signs reviewed and stable Respiratory status: spontaneous breathing Cardiovascular status: stable Anesthetic complications: no   No complications documented.  Last Vitals:  Vitals:   10/25/19 1030 10/25/19 1100  BP: (!) 126/92 133/82  Pulse: 60 66  Resp: 18 17  Temp: 36.6 C 36.6 C  SpO2: 93% 92%    Last Pain:  Vitals:   10/25/19 1100  TempSrc: Oral  PainSc: 0-No pain                 Nolon Nations

## 2019-10-25 NOTE — Anesthesia Procedure Notes (Signed)
Procedure Name: LMA Insertion Date/Time: 10/25/2019 8:39 AM Performed by: Montel Clock, CRNA Pre-anesthesia Checklist: Patient identified, Emergency Drugs available, Suction available, Patient being monitored and Timeout performed Patient Re-evaluated:Patient Re-evaluated prior to induction Oxygen Delivery Method: Circle system utilized Preoxygenation: Pre-oxygenation with 100% oxygen Induction Type: IV induction LMA: LMA with gastric port inserted LMA Size: 5.0 Number of attempts: 1 Dental Injury: Teeth and Oropharynx as per pre-operative assessment

## 2019-10-25 NOTE — Discharge Instructions (Signed)
ARTHROSCOPIC KNEE SURGERY HOME CARE INSTRUCTIONS   PAIN You will be expected to have a moderate amount of pain in the affected knee for approximately two weeks.  However, the first two to four days will be the most severe in terms of the pain you will experience.  Prescriptions have been provided for you to take as needed for the pain.  The pain can be markedly reduced by using the ice/compressive bandage given.  Exchange the ice packs whenever they thaw.  During the night, keep the bandage on because it will still provide some compression for the swelling.  Also, keep the leg elevated on pillows above your heart, and this will help alleviate the pain and swelling.  MEDICATION Prescriptions have been provided to take as needed for pain. To prevent blood clots, take Aspirin 325mg  daily with a meal if not on a blood thinner and if no history of stomach ulcers.  ACTIVITY It is preferred that you stay on bedrest for approximately 24 hours.  However, you may go to the bathroom with help.  After this, you can start to be up and about progressively more.  Remember that the swelling may still increase after three to four days if you are up and doing too much.  You may put as much weight on the affected leg as pain will allow.  Use your crutches for comfort and safety.  However, as soon as you are able, you may discard the crutches and go without them.   DRESSING Keep the current dressing as dry as possible.  Two days after your surgery, you may remove the ice/compressive wrap, and surgical dressing.  You may now take a shower, but do not scrub the sounds directly with soap.  Let water rinse over these and gently wipe with your hand.  Reapply band-aids over the puncture wounds and more gauze if needed.  A slight amount of thin drainage can be normal at this time, and do not let it frighten you.  Reapply the ice/compressive wrap.  You may now repeat this every day each time you shower.  SYMPTOMS TO REPORT TO  YOUR DOCTOR  -Extreme pain.  -Extreme swelling.  -Temperature above 101 degrees that does not come down with acetaminophen     (Tylenol).  -Any changes in the feeling, color or movement of your toes.  -Extreme redness, heat, swelling or drainage at your incision  EXERCISE It is preferred that you begin to exercise on the day of your surgery.  Straight leg raises and short arc quads should be begun the afternoon or evening of surgery and continued until you come back for your follow-up appointment.   Attached is an instruction sheet on how to perform these two simple exercises.  Do these at least three times per day if not more.  You may bend your knee as much as is comfortable.  The puncture wounds may occasionally be slightly uncomfortable with bending of the knee.  Do not let this frighten you.  It is important to keep your knee motion, but do not overdo it.  If you have significant pain, simply do not bend the knee as far.   You will be given more exercises to perform at your first return visit.    Quadriceps (Straight Leg Raises)    Lie flat with ____ pound weight around left ankle. Keeping knee straight, lift ____ inches. May do exercise sitting with back against wall. Keep back straight. Hold ____ seconds. Repeat ____ times. Do  ____ sessions per day. CAUTION: Move slowly, avoid jerking.  Copyright  VHI. All rights reserved.   KNEE: Extension, Short Arc Quads - Supine    Place bolster under knees. Raise one leg until knee is straight. ___ reps per set, ___ sets per day, ___ days per week   Copyright  VHI. All rights reserved.     RETURN APPOINTMENT Please make an appointment to be seen by your doctor in 10-14 days from your surgery.  Patient Signature:  ________________________________________________________  Nurse's Signature:  ________________________________________________________

## 2019-10-26 ENCOUNTER — Encounter (HOSPITAL_COMMUNITY): Payer: Self-pay | Admitting: Specialist

## 2019-10-30 DIAGNOSIS — F410 Panic disorder [episodic paroxysmal anxiety] without agoraphobia: Secondary | ICD-10-CM | POA: Diagnosis not present

## 2019-10-30 DIAGNOSIS — F411 Generalized anxiety disorder: Secondary | ICD-10-CM | POA: Diagnosis not present

## 2019-11-21 ENCOUNTER — Ambulatory Visit: Payer: BC Managed Care – PPO | Admitting: Family Medicine

## 2019-11-23 DIAGNOSIS — M25561 Pain in right knee: Secondary | ICD-10-CM | POA: Diagnosis not present

## 2019-11-27 DIAGNOSIS — M25561 Pain in right knee: Secondary | ICD-10-CM | POA: Diagnosis not present

## 2019-11-28 DIAGNOSIS — F410 Panic disorder [episodic paroxysmal anxiety] without agoraphobia: Secondary | ICD-10-CM | POA: Diagnosis not present

## 2019-12-04 DIAGNOSIS — M25561 Pain in right knee: Secondary | ICD-10-CM | POA: Diagnosis not present

## 2019-12-14 ENCOUNTER — Other Ambulatory Visit: Payer: Self-pay | Admitting: Family Medicine

## 2019-12-14 DIAGNOSIS — E785 Hyperlipidemia, unspecified: Secondary | ICD-10-CM

## 2019-12-18 DIAGNOSIS — G894 Chronic pain syndrome: Secondary | ICD-10-CM | POA: Diagnosis not present

## 2019-12-26 ENCOUNTER — Other Ambulatory Visit: Payer: Self-pay | Admitting: Family Medicine

## 2019-12-26 DIAGNOSIS — E785 Hyperlipidemia, unspecified: Secondary | ICD-10-CM

## 2020-01-01 ENCOUNTER — Other Ambulatory Visit: Payer: Self-pay | Admitting: Family Medicine

## 2020-01-01 DIAGNOSIS — E785 Hyperlipidemia, unspecified: Secondary | ICD-10-CM

## 2020-01-02 DIAGNOSIS — Z4889 Encounter for other specified surgical aftercare: Secondary | ICD-10-CM | POA: Diagnosis not present

## 2020-01-11 ENCOUNTER — Other Ambulatory Visit: Payer: Self-pay | Admitting: Family Medicine

## 2020-01-11 DIAGNOSIS — E785 Hyperlipidemia, unspecified: Secondary | ICD-10-CM

## 2020-01-30 ENCOUNTER — Telehealth: Payer: Self-pay | Admitting: Family Medicine

## 2020-01-30 NOTE — Telephone Encounter (Signed)
Wife called and states pt needs new CPAP.  He lost the last rx you gave him and still using the old one.  Can you re-write for a new CPAP and mask?

## 2020-01-31 NOTE — Telephone Encounter (Signed)
Pt or wife will pick up today. [place]

## 2020-01-31 NOTE — Telephone Encounter (Signed)
done

## 2020-02-12 DIAGNOSIS — M25561 Pain in right knee: Secondary | ICD-10-CM | POA: Diagnosis not present

## 2020-02-13 ENCOUNTER — Other Ambulatory Visit: Payer: Self-pay | Admitting: Family Medicine

## 2020-02-13 DIAGNOSIS — I10 Essential (primary) hypertension: Secondary | ICD-10-CM

## 2020-02-20 DIAGNOSIS — M1711 Unilateral primary osteoarthritis, right knee: Secondary | ICD-10-CM | POA: Diagnosis not present

## 2020-03-11 DIAGNOSIS — Z20828 Contact with and (suspected) exposure to other viral communicable diseases: Secondary | ICD-10-CM | POA: Diagnosis not present

## 2020-03-12 DIAGNOSIS — M25561 Pain in right knee: Secondary | ICD-10-CM | POA: Diagnosis not present

## 2020-03-12 DIAGNOSIS — Z20828 Contact with and (suspected) exposure to other viral communicable diseases: Secondary | ICD-10-CM | POA: Diagnosis not present

## 2020-03-19 ENCOUNTER — Ambulatory Visit: Payer: BC Managed Care – PPO | Admitting: Family Medicine

## 2020-03-19 ENCOUNTER — Other Ambulatory Visit: Payer: Self-pay

## 2020-03-19 ENCOUNTER — Encounter: Payer: Self-pay | Admitting: Family Medicine

## 2020-03-19 VITALS — BP 108/72 | HR 87 | Temp 98.4°F | Wt 290.6 lb

## 2020-03-19 DIAGNOSIS — M109 Gout, unspecified: Secondary | ICD-10-CM

## 2020-03-19 MED ORDER — COLCHICINE 0.6 MG PO TABS: 0.6000 mg | ORAL_TABLET | Freq: Two times a day (BID) | ORAL | 0 refills | Status: DC

## 2020-03-19 NOTE — Patient Instructions (Signed)
Gout  Gout is painful swelling of your joints. Gout is a type of arthritis. It is caused by having too much uric acid in your body. Uric acid is a chemical that is made when your body breaks down substances called purines. If your body has too much uric acid, sharp crystals can form and build up in your joints. This causes pain and swelling. Gout attacks can happen quickly and be very painful (acute gout). Over time, the attacks can affect more joints and happen more often (chronic gout). What are the causes?  Too much uric acid in your blood. This can happen because: ? Your kidneys do not remove enough uric acid from your blood. ? Your body makes too much uric acid. ? You eat too many foods that are high in purines. These foods include organ meats, some seafood, and beer.  Trauma or stress. What increases the risk?  Having a family history of gout.  Being male and middle-aged.  Being male and having gone through menopause.  Being very overweight (obese).  Drinking alcohol, especially beer.  Not having enough water in the body (being dehydrated).  Losing weight too quickly.  Having an organ transplant.  Having lead poisoning.  Taking certain medicines.  Having kidney disease.  Having a skin condition called psoriasis. What are the signs or symptoms? An attack of acute gout usually happens in just one joint. The most common place is the big toe. Attacks often start at night. Other joints that may be affected include joints of the feet, ankle, knee, fingers, wrist, or elbow. Symptoms of an attack may include:  Very bad pain.  Warmth.  Swelling.  Stiffness.  Shiny, red, or purple skin.  Tenderness. The affected joint may be very painful to touch.  Chills and fever. Chronic gout may cause symptoms more often. More joints may be involved. You may also have white or yellow lumps (tophi) on your hands or feet or in other areas near your joints. How is this  treated?  Treatment for this condition has two phases: treating an acute attack and preventing future attacks.  Acute gout treatment may include: ? NSAIDs. ? Steroids. These are taken by mouth or injected into a joint. ? Colchicine. This medicine relieves pain and swelling. It can be given by mouth or through an IV tube.  Preventive treatment may include: ? Taking small doses of NSAIDs or colchicine daily. ? Using a medicine that reduces uric acid levels in your blood. ? Making changes to your diet. You may need to see a food expert (dietitian) about what to eat and drink to prevent gout. Follow these instructions at home: During a gout attack   If told, put ice on the painful area: ? Put ice in a plastic bag. ? Place a towel between your skin and the bag. ? Leave the ice on for 20 minutes, 2-3 times a day.  Raise (elevate) the painful joint above the level of your heart as often as you can.  Rest the joint as much as possible. If the joint is in your leg, you may be given crutches.  Follow instructions from your doctor about what you cannot eat or drink. Avoiding future gout attacks  Eat a low-purine diet. Avoid foods and drinks such as: ? Liver. ? Kidney. ? Anchovies. ? Asparagus. ? Herring. ? Mushrooms. ? Mussels. ? Beer.  Stay at a healthy weight. If you want to lose weight, talk with your doctor. Do not lose weight   too fast.  Start or continue an exercise plan as told by your doctor. Eating and drinking  Drink enough fluids to keep your pee (urine) pale yellow.  If you drink alcohol: ? Limit how much you use to:  0-1 drink a day for women.  0-2 drinks a day for men. ? Be aware of how much alcohol is in your drink. In the [place] one drink equals one 12 oz bottle of beer (355 mL), one 5 oz glass of wine (148 mL), or one 1 oz glass of hard liquor (44 mL). General instructions  Take over-the-counter and prescription medicines only as told by your doctor.  Do  not drive or use heavy machinery while taking prescription pain medicine.  Return to your normal activities as told by your doctor. Ask your doctor what activities are safe for you.  Keep all follow-up visits as told by your doctor. This is important. Contact a doctor if:  You have another gout attack.  You still have symptoms of a gout attack after 10 days of treatment.  You have problems (side effects) because of your medicines.  You have chills or a fever.  You have burning pain when you pee (urinate).  You have pain in your lower back or belly. Get help right away if:  You have very bad pain.  Your pain cannot be controlled.  You cannot pee. Summary  Gout is painful swelling of the joints.  The most common site of pain is the big toe, but it can affect other joints.  Medicines and avoiding some foods can help to prevent and treat gout attacks. This information is not intended to replace advice given to you by your health care provider. Make sure you discuss any questions you have with your health care provider. Document Revised: 09/22/2017 Document Reviewed: 09/22/2017 Elsevier Patient Education  2020 [employer]. Take the colchicine twice per day and you can also take 4 ibuprofen 3 times per day until the same quiets down

## 2020-03-19 NOTE — Progress Notes (Signed)
   Subjective:    Patient ID: Gerald Larsen, male    DOB: 1965/09/29, 55 y.o.   MRN: [id number]  HPI He complains of a 3-day history of left great toe pain and swelling.  He states that bedsheets make it excruciatingly painful.  He has not had any previous difficulty with this but does have a family history of gout.  He has no previous history of difficulty with this.   Review of Systems     Objective:   Physical Exam Exam of the left great toe shows redness swelling warmth and tenderness to palpation over the MTP joint       Assessment & Plan:  Acute gouty arthritis - Plan: colchicine 0.6 MG tablet He is also to take NSAID during the same timeframe.  Discussed the treatment of gout and since this is his first episode, no further intervention will be entertained.  He was comfortable with that.

## 2020-03-22 DIAGNOSIS — M25561 Pain in right knee: Secondary | ICD-10-CM | POA: Diagnosis not present

## 2020-03-26 ENCOUNTER — Other Ambulatory Visit: Payer: Self-pay | Admitting: Family Medicine

## 2020-03-26 DIAGNOSIS — M109 Gout, unspecified: Secondary | ICD-10-CM

## 2020-03-26 NOTE — Telephone Encounter (Signed)
[facility] is requesting to fill pt colchicine . It looks as if it was sent over a week ago for 30 days. [place]

## 2020-04-02 ENCOUNTER — Other Ambulatory Visit: Payer: Self-pay | Admitting: Family Medicine

## 2020-04-02 DIAGNOSIS — E785 Hyperlipidemia, unspecified: Secondary | ICD-10-CM

## 2020-04-03 DIAGNOSIS — F410 Panic disorder [episodic paroxysmal anxiety] without agoraphobia: Secondary | ICD-10-CM | POA: Diagnosis not present

## 2020-04-04 ENCOUNTER — Telehealth: Payer: Self-pay | Admitting: Family Medicine

## 2020-04-04 MED ORDER — HYDROCODONE-ACETAMINOPHEN 5-325 MG PO TABS: 1.0000 | ORAL_TABLET | Freq: Four times a day (QID) | ORAL | 0 refills | Status: DC | PRN

## 2020-04-04 NOTE — Telephone Encounter (Signed)
Received a message from pt stating that he has had a migraine for 3 days. He states that he the past he has been given Norco. Pt is requesting it be sent into [facility]. Pt can be reached at [phone].

## 2020-04-19 DIAGNOSIS — G8929 Other chronic pain: Secondary | ICD-10-CM | POA: Diagnosis not present

## 2020-04-30 DIAGNOSIS — F410 Panic disorder [episodic paroxysmal anxiety] without agoraphobia: Secondary | ICD-10-CM | POA: Diagnosis not present

## 2020-05-09 DIAGNOSIS — M1711 Unilateral primary osteoarthritis, right knee: Secondary | ICD-10-CM | POA: Diagnosis not present

## 2020-05-28 DIAGNOSIS — F411 Generalized anxiety disorder: Secondary | ICD-10-CM | POA: Diagnosis not present

## 2020-06-03 ENCOUNTER — Other Ambulatory Visit: Payer: Self-pay

## 2020-06-03 ENCOUNTER — Encounter: Payer: Self-pay | Admitting: Medical

## 2020-06-03 ENCOUNTER — Ambulatory Visit: Payer: BC Managed Care – PPO | Admitting: Medical

## 2020-06-03 VITALS — BP 130/68 | HR 86 | Ht 72.0 in | Wt 297.2 lb

## 2020-06-03 DIAGNOSIS — R7303 Prediabetes: Secondary | ICD-10-CM

## 2020-06-03 DIAGNOSIS — M109 Gout, unspecified: Secondary | ICD-10-CM | POA: Diagnosis not present

## 2020-06-03 MED ORDER — COLCHICINE 0.6 MG PO TABS: 0.6000 mg | ORAL_TABLET | Freq: Two times a day (BID) | ORAL | 1 refills | Status: DC

## 2020-06-03 MED ORDER — HYDROCODONE-ACETAMINOPHEN 5-325 MG PO TABS: 1.0000 | ORAL_TABLET | Freq: Four times a day (QID) | ORAL | 0 refills | Status: DC | PRN

## 2020-06-03 NOTE — Progress Notes (Signed)
Subjective: Chief Complaint  Patient presents with  . Gout    Pt present for gout in right Great toe    Here for gout concern x 2-3 weeks.   Has had prior gout in left foot, now seems to have in right foot.  Right great toe swollen, red, hurting.  Similar to left foot a few months ago.  Back in January there was suspected gout, but now he thinks this is a similar episode.  Just started back on colchicine which was helping but ran out of pills.  No fever.   Denies injury, fall or trauma to foot.    Of note, he only takes 1 tablet of the Lisinopril HCT 20/12.5mg  daily versus the label showing 2 tablets daily.    He notes that his brother has hx/o bad gout  No other aggravating or relieving factors. No other complaint.  Past Medical History:  Diagnosis Date  . Acute meniscal tear of left knee   . Anxiety   . Arthritis   . GERD (gastroesophageal reflux disease)   . Glucose intolerance   . Hyperlipidemia   . Hypertension   . Hypogonadism male   . Migraines   . OSA on CPAP    and uses CPAP  . Pre-diabetes    8-10 years ago lost weight   . Wears contact lenses    Current Outpatient Medications on File Prior to Visit  Medication Sig Dispense Refill  . ALPRAZolam (XANAX) 0.5 MG tablet Take 1 tablet (0.5 mg total) by mouth at bedtime as needed for anxiety. 20 tablet 0  . escitalopram (LEXAPRO) 10 MG tablet Take 10 mg by mouth daily.    Marland Kitchen ibuprofen (ADVIL) 200 MG tablet Take 400 mg by mouth every 6 (six) hours as needed for moderate pain.    Marland Kitchen lisinopril-hydrochlorothiazide (ZESTORETIC) 20-12.5 MG tablet TAKE 2 TABLETS BY MOUTH EVERY DAY 180 tablet 3  . ondansetron (ZOFRAN) 4 MG tablet Take 1 tablet (4 mg total) by mouth every 6 (six) hours. 12 tablet 0  . pravastatin (PRAVACHOL) 40 MG tablet TAKE 1 TABLET BY MOUTH EVERY DAY 90 tablet 0  . sildenafil (REVATIO) 20 MG tablet Take up to 5 pills as needed for erections. 20 tablet 5  . traMADol (ULTRAM) 50 MG tablet Take 100 mg by mouth in  the morning, at noon, and at bedtime.     Marland Kitchen acetaminophen (TYLENOL) 500 MG tablet Take 500 mg by mouth every 6 (six) hours as needed for headache. PRN (Patient not taking: No sig reported)    . omeprazole (PRILOSEC) 40 MG capsule TAKE 1 CAPSULE BY MOUTH EVERY DAY (Patient not taking: No sig reported) 90 capsule 3   No current facility-administered medications on file prior to visit.   ROS as in subjective   Objective BP 130/68   Pulse 86   Ht 6' (1.829 m)   Wt 297 lb 3.2 oz (134.8 kg)   SpO2 92%   BMI 40.31 kg/m   General appearence: alert, no distress, WD/WN,  Pulses: 2+ symmetric, upper and lower extremities, normal cap refill Tender right great toe at MTP, decreased ROM of MTP, general flushing/redness of great toe on right, otherwise foot nontender, and unremarkable Feet neurovascularly intact    Assessment: Encounter Diagnoses  Name Primary?  . Prediabetes Yes  . Gout involving toe of right foot, unspecified cause, unspecified chronicity   . Acute gouty arthritis      Plan: We discussed his concerns.  Suspect gout.  Begin medication as below.  This is a similar regimen that works for him back in January for the left great toe.  We will check a uric acid today.  He has been prediabetic.  He does drink soda regularly.  We discussed diagnosis of gout, treatment and prevention.  At least consider substituting out thiazide diuretic if uric acid comes back high.  Advise he cut out or cut way back on soda and sugary foods  We discussed that gout diagnosis requires joint aspiration.  We will declined this today for now.  We discussed the potential for going on allopurinol or Uloric to reduce uric acid given potential for ongoing gout and damage to the kidney  Prediabetes-I will check his A1c today.  It was borderline at 7.0% back in August 2021.  Follow-up with PCP in this regard   Priscilla was seen today for gout.  Diagnoses and all orders for this  visit:  Prediabetes -     Hemoglobin A1c  Gout involving toe of right foot, unspecified cause, unspecified chronicity -     Uric acid  Acute gouty arthritis -     colchicine 0.6 MG tablet; Take 1 tablet (0.6 mg total) by mouth 2 (two) times daily.  Other orders -     HYDROcodone-acetaminophen (NORCO) 5-325 MG tablet; Take 1 tablet by mouth every 6 (six) hours as needed for moderate pain.   F/u pending labs, with PCP

## 2020-06-04 ENCOUNTER — Other Ambulatory Visit: Payer: Self-pay | Admitting: Medical

## 2020-06-04 LAB — HEMOGLOBIN A1C
Est. average glucose Bld gHb Est-mCnc: 169 mg/dL
Hgb A1c MFr Bld: 7.5 % — ABNORMAL HIGH (ref 4.8–5.6)

## 2020-06-04 LAB — URIC ACID: Uric Acid: 9.1 mg/dL — ABNORMAL HIGH (ref 3.8–8.4)

## 2020-06-04 MED ORDER — ALLOPURINOL 100 MG PO TABS: 100.0000 mg | ORAL_TABLET | Freq: Every day | ORAL | 1 refills | Status: DC

## 2020-06-10 ENCOUNTER — Other Ambulatory Visit: Payer: Self-pay | Admitting: Medical

## 2020-06-10 DIAGNOSIS — M109 Gout, unspecified: Secondary | ICD-10-CM

## 2020-06-25 DIAGNOSIS — F411 Generalized anxiety disorder: Secondary | ICD-10-CM | POA: Diagnosis not present

## 2020-06-30 ENCOUNTER — Other Ambulatory Visit: Payer: Self-pay | Admitting: Medical

## 2020-07-09 DIAGNOSIS — G4733 Obstructive sleep apnea (adult) (pediatric): Secondary | ICD-10-CM | POA: Diagnosis not present

## 2020-07-25 ENCOUNTER — Other Ambulatory Visit: Payer: Self-pay | Admitting: Medical

## 2020-07-25 DIAGNOSIS — M109 Gout, unspecified: Secondary | ICD-10-CM

## 2020-08-05 ENCOUNTER — Other Ambulatory Visit: Payer: Self-pay | Admitting: Family Medicine

## 2020-08-05 DIAGNOSIS — M109 Gout, unspecified: Secondary | ICD-10-CM

## 2020-08-05 NOTE — Telephone Encounter (Signed)
Please advise on [facility]colchine requested [facility]. [facility]

## 2020-08-08 DIAGNOSIS — G4733 Obstructive sleep apnea (adult) (pediatric): Secondary | ICD-10-CM | POA: Diagnosis not present

## 2020-08-16 ENCOUNTER — Other Ambulatory Visit: Payer: Self-pay | Admitting: Family Medicine

## 2020-08-16 DIAGNOSIS — E785 Hyperlipidemia, unspecified: Secondary | ICD-10-CM

## 2020-08-22 DIAGNOSIS — M5136 Other intervertebral disc degeneration, lumbar region: Secondary | ICD-10-CM | POA: Diagnosis not present

## 2020-08-23 ENCOUNTER — Other Ambulatory Visit: Payer: Self-pay | Admitting: Family Medicine

## 2020-08-23 DIAGNOSIS — M109 Gout, unspecified: Secondary | ICD-10-CM

## 2020-08-23 NOTE — Telephone Encounter (Signed)
[facility] to [facility]. Please advise [facility]

## 2020-09-02 ENCOUNTER — Telehealth: Payer: Self-pay

## 2020-09-02 ENCOUNTER — Telehealth: Payer: Self-pay | Admitting: Family Medicine

## 2020-09-02 NOTE — Telephone Encounter (Signed)
Pt called and states he has been having headaches. He is requesting a refill on Norco. Please send to [place]. Pt can be reached at [phone].

## 2020-09-02 NOTE — Telephone Encounter (Signed)
Left message to inform of Dr. Lanice Shirts message (see previous telephone call) and also pt needs schedule med check

## 2020-09-04 NOTE — Telephone Encounter (Signed)
LVM for pt to call and advise how he is doing. [place]

## 2020-09-08 DIAGNOSIS — G4733 Obstructive sleep apnea (adult) (pediatric): Secondary | ICD-10-CM | POA: Diagnosis not present

## 2020-09-09 NOTE — Telephone Encounter (Signed)
Lvm for pt to call back to advise how he was doing. [place]

## 2020-09-14 ENCOUNTER — Other Ambulatory Visit: Payer: Self-pay | Admitting: Family Medicine

## 2020-09-14 DIAGNOSIS — E785 Hyperlipidemia, unspecified: Secondary | ICD-10-CM

## 2020-10-08 DIAGNOSIS — G4733 Obstructive sleep apnea (adult) (pediatric): Secondary | ICD-10-CM | POA: Diagnosis not present

## 2020-10-10 DIAGNOSIS — M170 Bilateral primary osteoarthritis of knee: Secondary | ICD-10-CM | POA: Diagnosis not present

## 2020-10-15 ENCOUNTER — Other Ambulatory Visit: Payer: Self-pay | Admitting: Family Medicine

## 2020-10-15 DIAGNOSIS — E785 Hyperlipidemia, unspecified: Secondary | ICD-10-CM

## 2020-11-08 DIAGNOSIS — G4733 Obstructive sleep apnea (adult) (pediatric): Secondary | ICD-10-CM | POA: Diagnosis not present

## 2020-11-09 ENCOUNTER — Other Ambulatory Visit: Payer: Self-pay | Admitting: Family Medicine

## 2020-11-09 DIAGNOSIS — M109 Gout, unspecified: Secondary | ICD-10-CM

## 2020-11-11 NOTE — Telephone Encounter (Signed)
Is this okay to refill? 

## 2020-11-22 DIAGNOSIS — M5136 Other intervertebral disc degeneration, lumbar region: Secondary | ICD-10-CM | POA: Diagnosis not present

## 2020-11-22 DIAGNOSIS — Z79899 Other long term (current) drug therapy: Secondary | ICD-10-CM | POA: Diagnosis not present

## 2020-11-22 DIAGNOSIS — Z5181 Encounter for therapeutic drug level monitoring: Secondary | ICD-10-CM | POA: Diagnosis not present

## 2020-11-24 ENCOUNTER — Other Ambulatory Visit: Payer: Self-pay | Admitting: Medical

## 2020-11-29 ENCOUNTER — Other Ambulatory Visit: Payer: Self-pay | Admitting: Medical

## 2020-11-29 DIAGNOSIS — M109 Gout, unspecified: Secondary | ICD-10-CM

## 2020-12-04 ENCOUNTER — Encounter: Payer: BC Managed Care – PPO | Admitting: Family Medicine

## 2020-12-09 DIAGNOSIS — G4733 Obstructive sleep apnea (adult) (pediatric): Secondary | ICD-10-CM | POA: Diagnosis not present

## 2020-12-11 ENCOUNTER — Other Ambulatory Visit: Payer: Self-pay | Admitting: Family Medicine

## 2020-12-11 DIAGNOSIS — M109 Gout, unspecified: Secondary | ICD-10-CM

## 2020-12-16 DIAGNOSIS — F411 Generalized anxiety disorder: Secondary | ICD-10-CM | POA: Diagnosis not present

## 2020-12-20 ENCOUNTER — Telehealth: Payer: Self-pay

## 2020-12-20 ENCOUNTER — Telehealth: Payer: Self-pay | Admitting: Internal Medicine

## 2020-12-20 ENCOUNTER — Other Ambulatory Visit: Payer: Self-pay

## 2020-12-20 ENCOUNTER — Ambulatory Visit: Payer: BC Managed Care – PPO | Admitting: Medical

## 2020-12-20 VITALS — BP 122/80 | HR 67 | Wt 300.0 lb

## 2020-12-20 DIAGNOSIS — R7301 Impaired fasting glucose: Secondary | ICD-10-CM | POA: Diagnosis not present

## 2020-12-20 DIAGNOSIS — R109 Unspecified abdominal pain: Secondary | ICD-10-CM

## 2020-12-20 DIAGNOSIS — M79642 Pain in left hand: Secondary | ICD-10-CM

## 2020-12-20 DIAGNOSIS — K529 Noninfective gastroenteritis and colitis, unspecified: Secondary | ICD-10-CM | POA: Insufficient documentation

## 2020-12-20 DIAGNOSIS — G4730 Sleep apnea, unspecified: Secondary | ICD-10-CM

## 2020-12-20 DIAGNOSIS — R200 Anesthesia of skin: Secondary | ICD-10-CM | POA: Insufficient documentation

## 2020-12-20 DIAGNOSIS — M109 Gout, unspecified: Secondary | ICD-10-CM

## 2020-12-20 DIAGNOSIS — G8929 Other chronic pain: Secondary | ICD-10-CM | POA: Insufficient documentation

## 2020-12-20 DIAGNOSIS — M79641 Pain in right hand: Secondary | ICD-10-CM

## 2020-12-20 DIAGNOSIS — M1A9XX0 Chronic gout, unspecified, without tophus (tophi): Secondary | ICD-10-CM

## 2020-12-20 DIAGNOSIS — R635 Abnormal weight gain: Secondary | ICD-10-CM | POA: Diagnosis not present

## 2020-12-20 DIAGNOSIS — Z6841 Body Mass Index (BMI) 40.0 and over, adult: Secondary | ICD-10-CM

## 2020-12-20 DIAGNOSIS — K219 Gastro-esophageal reflux disease without esophagitis: Secondary | ICD-10-CM

## 2020-12-20 DIAGNOSIS — E785 Hyperlipidemia, unspecified: Secondary | ICD-10-CM

## 2020-12-20 LAB — POCT CBG (FASTING - GLUCOSE)-MANUAL ENTRY: Glucose Fasting, POC: 156 mg/dL — AB (ref 70–99)

## 2020-12-20 MED ORDER — HYDROCODONE-ACETAMINOPHEN 5-325 MG PO TABS: 1.0000 | ORAL_TABLET | Freq: Four times a day (QID) | ORAL | 0 refills | Status: DC | PRN

## 2020-12-20 MED ORDER — COLCHICINE 0.6 MG PO TABS: 0.6000 mg | ORAL_TABLET | Freq: Two times a day (BID) | ORAL | 1 refills | Status: DC

## 2020-12-20 MED ORDER — OMEPRAZOLE 40 MG PO CPDR: DELAYED_RELEASE_CAPSULE | ORAL | 0 refills | Status: DC

## 2020-12-20 MED ORDER — GABAPENTIN 100 MG PO CAPS: 100.0000 mg | ORAL_CAPSULE | Freq: Two times a day (BID) | ORAL | 0 refills | Status: DC

## 2020-12-20 MED ORDER — ALLOPURINOL 300 MG PO TABS: 300.0000 mg | ORAL_TABLET | Freq: Every day | ORAL | 2 refills | Status: DC

## 2020-12-20 MED ORDER — PRAVASTATIN SODIUM 40 MG PO TABS: 40.0000 mg | ORAL_TABLET | Freq: Every day | ORAL | 0 refills | Status: DC

## 2020-12-20 NOTE — Patient Instructions (Signed)
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  Begin reinforced nighttime wrist splint and possibly even use an arm sling for the time being as needed.  Use the wrist splint daily for now.  Referral back to orthopedics.  Begin gabapentin to help with symptoms.  I prescribed some Norco hydrocodone short-term for pain.  Discussed risk benefits and proper use of medication.  Chronic abdominal pain, chronic diarrhea-still does abnormal findings on the 2017 CT abdomen pelvis showing possible colitis.  He has had chronic symptoms for months.  Labs today and referral back to gastroenterology for further evaluation  Impaired glucose, weight gain - random nonfasting glucose 156 today.  Additional labs as below.  Counseled on the need to work on healthy diet, exercise and weight loss  Sleep apnea-continue CPAP  Chronic gout-increase to allopurinol 3 mg daily.  Use colchicine for the next week and a half while he adjust the dose then go to as needed for colchicine.  Dyslipidemia-he has not been compliant on statin for months as the pharmacist told him not to use statin while home colchicine.  Get back on statin after 10 days of colchicine as needed.  The colchicine will be as needed after 10 days.

## 2020-12-20 NOTE — Telephone Encounter (Signed)
P.A. OMEPRAZOLE  

## 2020-12-20 NOTE — Telephone Encounter (Signed)
Pharmacy called and spoke to shane and advised shane that patient has just been prescribed oxycodone. Patient never told Audelia Acton nor I so shane had me cancel it in the system and told pharmacy not to fill it.   Patient has been informed that he could not have both that if he had oxycodone he could use that

## 2020-12-20 NOTE — Addendum Note (Signed)
Addended by: Minette Headland A on: 12/20/2020 11:54 AM   Modules accepted: Orders

## 2020-12-20 NOTE — Progress Notes (Signed)
Subjective:  Gerald Larsen is a 55 y.o. male who presents for Chief Complaint  Patient presents with   arm and hand pain    Arm and hand pain x couple weeks/. Last night it went numb and tingling and very heavy and hasn't come back yet. Severe pain when trying to move hand     Here with wife today.   He notes pain, numnbess, and tingling for weeks .  For 2 weeks arms have been going to sleep day and night.  Usually this goes away but this has been persistent.  Since last night right hand is numb.   No injury, no fall, no trauma.   No neck pain.   Last night right hand went numb and wont let up.   Been gradually getting worse.  Works in [employer], uses [employer] on the job.   No chest pain, no palpiations, no dyspne.   Has had some left ankle sweling, but improved by morning.  Using nothing for treatment for the arm.    Been having a lot of nausea.   Has vomited a few times.   Has chronic abdominal pain.    Hyperlipodemai - not taking pravacahol since starteing colchicine months ago per pharmaacist recommendatino given interaction between colchicne and pravachol..  Been on colchicine for gout for several months.    OSA - uses CPAP nightly  No other aggravating or relieving factors.    No other c/o.  Past Medical History:  Diagnosis Date   Acute meniscal tear of left knee    Anxiety    Arthritis    GERD (gastroesophageal reflux disease)    Glucose intolerance    Hyperlipidemia    Hypertension    Hypogonadism male    Migraines    OSA on CPAP    and uses CPAP   Pre-diabetes    8-10 years ago lost weight    Wears contact lenses    Current Outpatient Medications on File Prior to Visit  Medication Sig Dispense Refill   acetaminophen (TYLENOL) 500 MG tablet Take 500 mg by mouth every 6 (six) hours as needed for headache. PRN     ALPRAZolam (XANAX) 0.5 MG tablet Take 1 tablet (0.5 mg total) by mouth at bedtime as needed for anxiety. 20 tablet 0   escitalopram (LEXAPRO) 10  MG tablet Take 10 mg by mouth daily.     lisinopril-hydrochlorothiazide (ZESTORETIC) 20-12.5 MG tablet TAKE 2 TABLETS BY MOUTH EVERY DAY 180 tablet 3   omeprazole (PRILOSEC) 40 MG capsule TAKE 1 CAPSULE BY MOUTH EVERY DAY 90 capsule 3   sildenafil (REVATIO) 20 MG tablet Take up to 5 pills as needed for erections. 20 tablet 5   traMADol (ULTRAM) 50 MG tablet Take 100 mg by mouth in the morning, at noon, and at bedtime.      No current facility-administered medications on file prior to visit.     The following portions of the patient's history were reviewed and updated as appropriate: allergies, current medications, past family history, past medical history, past social history, past surgical history and problem list.  ROS Otherwise as in subjective above    Objective: BP 122/80   Pulse 67   Wt 300 lb (136.1 kg)   BMI 40.69 kg/m   General appearance: alert, no distress, well developed, well nourished Neck: supple, no lymphadenopathy, no thyromegaly, no masses, no bruit, decreased neck range of motion particularly with lateral rotation bilaterally, about 80% of normal. Heart: RRR, normal  S1, S2, no murmurs Lungs: CTA bilaterally, no wheezes, rhonchi, or rales Abdomen: +bs, soft, tender throughout lower abdomen and somewhat right side,, non distended, no masses, no hepatomegaly, no splenomegaly Pulses: 2+ radial pulses, 2+ pedal pulses, normal cap refill Ext: no edema MSK: There is surgical scars of the right palm from prior surgery, tender in the palm throughout, tender in the base of the thumb and several of the MCPs in the right hand, unable to fully flex the fingers of the right hand, otherwise nontender and rest of arm and wrist seem to have relatively normal range of motion Right hand and forearm with somewhat decreased or dulled sensation but still was able to differentiate between light and sharp touch of fingers and hand and forearm, unable to squeeze with the right hand, unable  to fully flex the fingers of the right hand, otherwise wrist and the rest of arm seems to have normal strength. CN II through XII intact, otherwise     Assessment: Encounter Diagnoses  Name Primary?   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  Begin reinforced nighttime wrist splint and possibly even use an arm sling for the time being as needed.  Use the wrist splint daily for now.  Referral back to orthopedics.  Begin gabapentin to help with symptoms.  I prescribed some Norco hydrocodone short-term for pain.  Discussed risk benefits and proper use of medication.  Chronic abdominal pain, chronic diarrhea-still does abnormal findings on the 2017 CT abdomen pelvis showing possible colitis.  He has had chronic symptoms for months.  Labs today and referral back to gastroenterology for further evaluation  Impaired glucose, weight gain - random nonfasting glucose 156 today.  Additional labs as below.  Counseled on the need to work on healthy diet, exercise and weight loss  Sleep apnea-continue CPAP  Chronic gout-increase to allopurinol 3 mg daily.  Use colchicine for the next week and a half while he adjust the dose then go to as needed for colchicine.  Dyslipidemia-he has not been compliant on statin for months as the pharmacist told him not to use statin while home colchicine.  Get back on statin after 10 days of colchicine as needed.  The colchicine will  be as needed after 10 days.    Gerald Larsen was seen today for arm and hand pain.  Diagnoses and all orders for this visit:  Bilateral hand numbness -     Comprehensive metabolic panel -     CBC with Differential/Platelet -     AMB referral to orthopedics  Bilateral hand pain -     Comprehensive metabolic panel -     CBC with Differential/Platelet -     AMB referral to orthopedics  Chronic abdominal pain -     Comprehensive metabolic panel -     CBC with Differential/Platelet -     Lipase -  Ambulatory referral to Gastroenterology  Chronic diarrhea -     Ambulatory referral to Gastroenterology  Impaired fasting blood sugar -     Hemoglobin A1c -     Glucose (CBG), Fasting  Weight gain -     Hemoglobin A1c -     Glucose (CBG), Fasting  Sleep apnea, unspecified type  BMI 40.0-44.9, adult (HCC)  Chronic gout without tophus, unspecified cause, unspecified site  Hyperlipidemia, unspecified hyperlipidemia type -     pravastatin (PRAVACHOL) 40 MG tablet; Take 1 tablet (40 mg total) by mouth daily.  Acute gouty arthritis -     colchicine 0.6 MG tablet; Take 1 tablet (0.6 mg total) by mouth 2 (two) times daily.  Other orders -     gabapentin (NEURONTIN) 100 MG capsule; Take 1 capsule (100 mg total) by mouth 2 (two) times daily. -     allopurinol (ZYLOPRIM) 300 MG tablet; Take 1 tablet (300 mg total) by mouth daily. -     HYDROcodone-acetaminophen (NORCO) 5-325 MG tablet; Take 1 tablet by mouth every 6 (six) hours as needed.   Follow up: pending labs, referrals

## 2020-12-21 LAB — COMPREHENSIVE METABOLIC PANEL
ALT: 72 IU/L — ABNORMAL HIGH (ref 0–44)
AST: 84 IU/L — ABNORMAL HIGH (ref 0–40)
Albumin/Globulin Ratio: 1.7 (ref 1.2–2.2)
Albumin: 4.4 g/dL (ref 3.8–4.9)
Alkaline Phosphatase: 85 IU/L (ref 44–121)
BUN/Creatinine Ratio: 13 (ref 9–20)
BUN: 13 mg/dL (ref 6–24)
Bilirubin Total: 0.6 mg/dL (ref 0.0–1.2)
CO2: 18 mmol/L — ABNORMAL LOW (ref 20–29)
Calcium: 9.9 mg/dL (ref 8.7–10.2)
Chloride: 100 mmol/L (ref 96–106)
Creatinine, Ser: 1.01 mg/dL (ref 0.76–1.27)
Globulin, Total: 2.6 g/dL (ref 1.5–4.5)
Glucose: 164 mg/dL — ABNORMAL HIGH (ref 70–99)
Potassium: 4.2 mmol/L (ref 3.5–5.2)
Sodium: 136 mmol/L (ref 134–144)
Total Protein: 7 g/dL (ref 6.0–8.5)
eGFR: 87 mL/min/{1.73_m2} (ref >59)

## 2020-12-21 LAB — HEMOGLOBIN A1C
Est. average glucose Bld gHb Est-mCnc: 160 mg/dL
Hgb A1c MFr Bld: 7.2 % — ABNORMAL HIGH (ref 4.8–5.6)

## 2020-12-21 LAB — CBC WITH DIFFERENTIAL/PLATELET
Basophils Absolute: 0 10*3/uL (ref 0.0–0.2)
Basos: 1 %
EOS (ABSOLUTE): 0.2 10*3/uL (ref 0.0–0.4)
Eos: 3 %
Hematocrit: 44.9 % (ref 37.5–51.0)
Hemoglobin: 14.8 g/dL (ref 13.0–17.7)
Immature Grans (Abs): 0 10*3/uL (ref 0.0–0.1)
Immature Granulocytes: 0 %
Lymphocytes Absolute: 1.6 10*3/uL (ref 0.7–3.1)
Lymphs: 28 %
MCH: 29.6 pg (ref 26.6–33.0)
MCHC: 33 g/dL (ref 31.5–35.7)
MCV: 90 fL (ref 79–97)
Monocytes Absolute: 0.3 10*3/uL (ref 0.1–0.9)
Monocytes: 6 %
Neutrophils Absolute: 3.6 10*3/uL (ref 1.4–7.0)
Neutrophils: 62 %
Platelets: 206 10*3/uL (ref 150–450)
RBC: 5 x10E6/uL (ref 4.14–5.80)
RDW: 13.3 % (ref 11.6–15.4)
WBC: 5.8 10*3/uL (ref 3.4–10.8)

## 2020-12-21 LAB — LIPASE: Lipase: 20 U/L (ref 13–78)

## 2020-12-23 DIAGNOSIS — G8929 Other chronic pain: Secondary | ICD-10-CM | POA: Diagnosis not present

## 2020-12-23 DIAGNOSIS — Z79891 Long term (current) use of opiate analgesic: Secondary | ICD-10-CM | POA: Diagnosis not present

## 2020-12-23 DIAGNOSIS — M5136 Other intervertebral disc degeneration, lumbar region: Secondary | ICD-10-CM | POA: Diagnosis not present

## 2020-12-23 DIAGNOSIS — M25561 Pain in right knee: Secondary | ICD-10-CM | POA: Diagnosis not present

## 2020-12-24 ENCOUNTER — Encounter: Payer: Self-pay | Admitting: Physician Assistant

## 2020-12-24 ENCOUNTER — Other Ambulatory Visit: Payer: Self-pay | Admitting: Internal Medicine

## 2020-12-24 DIAGNOSIS — K529 Noninfective gastroenteritis and colitis, unspecified: Secondary | ICD-10-CM

## 2020-12-24 DIAGNOSIS — G8929 Other chronic pain: Secondary | ICD-10-CM

## 2020-12-25 ENCOUNTER — Ambulatory Visit (HOSPITAL_COMMUNITY): Payer: BC Managed Care – PPO

## 2020-12-25 ENCOUNTER — Other Ambulatory Visit (HOSPITAL_COMMUNITY): Payer: Self-pay | Admitting: Gastroenterology

## 2020-12-25 ENCOUNTER — Other Ambulatory Visit: Payer: Self-pay | Admitting: Gastroenterology

## 2020-12-25 DIAGNOSIS — R1084 Generalized abdominal pain: Secondary | ICD-10-CM | POA: Diagnosis not present

## 2020-12-25 DIAGNOSIS — R197 Diarrhea, unspecified: Secondary | ICD-10-CM | POA: Diagnosis not present

## 2020-12-25 DIAGNOSIS — R195 Other fecal abnormalities: Secondary | ICD-10-CM | POA: Diagnosis not present

## 2020-12-26 ENCOUNTER — Encounter (HOSPITAL_COMMUNITY): Payer: Self-pay

## 2020-12-26 ENCOUNTER — Ambulatory Visit (HOSPITAL_COMMUNITY)
Admission: RE | Admit: 2020-12-26 | Discharge: 2020-12-26 | Disposition: A | Discharge status: Home / Self Care | Payer: BC Managed Care – PPO | Source: Ambulatory Visit | Attending: Gastroenterology | Admitting: Gastroenterology

## 2020-12-26 ENCOUNTER — Other Ambulatory Visit: Payer: Self-pay

## 2020-12-26 DIAGNOSIS — R1084 Generalized abdominal pain: Secondary | ICD-10-CM

## 2020-12-26 DIAGNOSIS — K760 Fatty (change of) liver, not elsewhere classified: Secondary | ICD-10-CM | POA: Diagnosis not present

## 2020-12-26 DIAGNOSIS — R109 Unspecified abdominal pain: Secondary | ICD-10-CM | POA: Diagnosis not present

## 2020-12-26 MED ORDER — IOHEXOL 350 MG/ML SOLN
80.0000 mL | Freq: Once | INTRAVENOUS | Status: AC | PRN
  Administered 2020-12-26: 80 mL via INTRAVENOUS

## 2020-12-27 ENCOUNTER — Other Ambulatory Visit: Payer: Self-pay | Admitting: Medical

## 2020-12-27 DIAGNOSIS — M109 Gout, unspecified: Secondary | ICD-10-CM

## 2020-12-27 NOTE — Telephone Encounter (Signed)
[facility] . Please advise [facility] [address]

## 2021-01-01 ENCOUNTER — Telehealth: Payer: Self-pay

## 2021-01-01 DIAGNOSIS — G5603 Carpal tunnel syndrome, bilateral upper limbs: Secondary | ICD-10-CM | POA: Diagnosis not present

## 2021-01-01 NOTE — Telephone Encounter (Signed)
Pt  was advised that he has a ppt with [facility]. Pt advise he confirmed by text. Pt was told to call them to make sure it was received. Gerald Larsen

## 2021-01-03 NOTE — Telephone Encounter (Signed)
P.A. denied, tried to reach pt earlier to offer Good Rx card, left message

## 2021-01-14 ENCOUNTER — Ambulatory Visit: Payer: BC Managed Care – PPO | Admitting: Physician Assistant

## 2021-01-14 ENCOUNTER — Other Ambulatory Visit: Payer: Self-pay | Admitting: Family Medicine

## 2021-01-14 DIAGNOSIS — M109 Gout, unspecified: Secondary | ICD-10-CM

## 2021-01-16 ENCOUNTER — Telehealth: Payer: Self-pay | Admitting: Family Medicine

## 2021-01-16 ENCOUNTER — Telehealth: Payer: Self-pay | Admitting: Internal Medicine

## 2021-01-16 ENCOUNTER — Other Ambulatory Visit: Payer: Self-pay | Admitting: Medical

## 2021-01-16 DIAGNOSIS — R7301 Impaired fasting glucose: Secondary | ICD-10-CM

## 2021-01-16 MED ORDER — METFORMIN HCL ER 750 MG PO TB24: 750.0000 mg | ORAL_TABLET | Freq: Every day | ORAL | 2 refills | Status: DC

## 2021-01-16 NOTE — Telephone Encounter (Signed)
Please advise last lab message pt wanted blood sugar medication to be sent in but I do not see this got done. Please advise

## 2021-01-16 NOTE — Telephone Encounter (Signed)
Pt was notified of results

## 2021-01-16 NOTE — Telephone Encounter (Signed)
Wife wants [facility] to [facility] and was told they would give him free meter, etc.

## 2021-01-17 NOTE — Telephone Encounter (Signed)
I have put referral in

## 2021-01-21 DIAGNOSIS — D124 Benign neoplasm of descending colon: Secondary | ICD-10-CM | POA: Diagnosis not present

## 2021-01-21 DIAGNOSIS — K5730 Diverticulosis of large intestine without perforation or abscess without bleeding: Secondary | ICD-10-CM | POA: Diagnosis not present

## 2021-01-21 DIAGNOSIS — R197 Diarrhea, unspecified: Secondary | ICD-10-CM | POA: Diagnosis not present

## 2021-01-21 DIAGNOSIS — D122 Benign neoplasm of ascending colon: Secondary | ICD-10-CM | POA: Diagnosis not present

## 2021-01-21 DIAGNOSIS — R195 Other fecal abnormalities: Secondary | ICD-10-CM | POA: Diagnosis not present

## 2021-01-21 DIAGNOSIS — K635 Polyp of colon: Secondary | ICD-10-CM | POA: Diagnosis not present

## 2021-01-21 LAB — HM COLONOSCOPY

## 2021-01-23 LAB — HM COLONOSCOPY

## 2021-01-24 ENCOUNTER — Encounter: Payer: Self-pay | Admitting: *Deleted

## 2021-01-27 DIAGNOSIS — F411 Generalized anxiety disorder: Secondary | ICD-10-CM | POA: Diagnosis not present

## 2021-01-29 DIAGNOSIS — E6609 Other obesity due to excess calories: Secondary | ICD-10-CM | POA: Diagnosis not present

## 2021-01-29 DIAGNOSIS — R112 Nausea with vomiting, unspecified: Secondary | ICD-10-CM | POA: Diagnosis not present

## 2021-01-29 DIAGNOSIS — R197 Diarrhea, unspecified: Secondary | ICD-10-CM | POA: Diagnosis not present

## 2021-02-12 DIAGNOSIS — G5603 Carpal tunnel syndrome, bilateral upper limbs: Secondary | ICD-10-CM | POA: Diagnosis not present

## 2021-03-04 ENCOUNTER — Encounter: Payer: Self-pay | Admitting: Family Medicine

## 2021-04-01 DIAGNOSIS — G894 Chronic pain syndrome: Secondary | ICD-10-CM | POA: Diagnosis not present

## 2021-04-02 DIAGNOSIS — F411 Generalized anxiety disorder: Secondary | ICD-10-CM | POA: Diagnosis not present

## 2021-04-18 ENCOUNTER — Other Ambulatory Visit: Payer: Self-pay | Admitting: Family Medicine

## 2021-04-18 DIAGNOSIS — I10 Essential (primary) hypertension: Secondary | ICD-10-CM

## 2021-04-28 ENCOUNTER — Other Ambulatory Visit: Payer: Self-pay | Admitting: Medical

## 2021-04-30 DIAGNOSIS — G5601 Carpal tunnel syndrome, right upper limb: Secondary | ICD-10-CM | POA: Diagnosis not present

## 2021-05-05 ENCOUNTER — Encounter (HOSPITAL_BASED_OUTPATIENT_CLINIC_OR_DEPARTMENT_OTHER): Payer: Self-pay | Admitting: Orthopedic Surgery

## 2021-05-05 ENCOUNTER — Other Ambulatory Visit: Payer: Self-pay

## 2021-05-05 DIAGNOSIS — E785 Hyperlipidemia, unspecified: Secondary | ICD-10-CM

## 2021-05-05 DIAGNOSIS — S025XXA Fracture of tooth (traumatic), initial encounter for closed fracture: Secondary | ICD-10-CM

## 2021-05-05 DIAGNOSIS — Z789 Other specified health status: Secondary | ICD-10-CM

## 2021-05-05 HISTORY — DX: Fracture of tooth (traumatic), initial encounter for closed fracture: S02.5XXA

## 2021-05-05 HISTORY — DX: Hyperlipidemia, unspecified: E78.5

## 2021-05-05 HISTORY — DX: Other specified health status: Z78.9

## 2021-05-05 NOTE — Progress Notes (Signed)
Spoke w/ via phone for pre-op interview---pt Lab needs dos----  ekg, istat             Lab results------none COVID test -----patient states asymptomatic no test needed Arrive at -------1045 am 05-08-2021 NPO after MN NO Solid Food.  Clear liquids from MN until---945 am Med rec completed Medications to take morning of surgery -----allopurinol, omeprazole, oxycodone prn, do not take lisinopril/hctz day of surgery Diabetic medication -----no metformin day of sugrery Patient instructed no nail polish to be worn day of surgery Patient instructed to bring photo id and insurance card day of surgery Patient aware to have Driver (ride ) / caregiver   wife dawn Belknap will stay for 24 hours after surgery  Patient Special Instructions -----bring cpap mask tubing and machine day of surgery and leave in car Pre-Op special Istructions -----none Patient verbalized understanding of instructions that were given at this phone interview. Patient denies shortness of breath, chest pain, fever, cough at this phone interview.

## 2021-05-06 NOTE — H&P (Signed)
Preoperative History & Physical Exam  Surgeon: Matt Holmes, MD  Diagnosis: Right Carpal Tunnel Syndrome  Planned Procedure: Procedure(s) (LRB): CARPAL TUNNEL RELEASE (Right)  History of Present Illness:   Patient is a 56 y.o. male with symptoms consistent with Right Carpal Tunnel Syndrome who presents for surgical intervention. The risks, benefits and alternatives of surgical intervention were discussed and informed consent was obtained prior to surgery.  Past Medical History:  Past Medical History:  Diagnosis Date   Acute meniscal tear of left knee    Anxiety    Arthritis    oa   Chipped tooth 05/05/2021   1 on front upper 1 on front lower per pt   GERD (gastroesophageal reflux disease)    Gout    last flare up 04-28-2021   history of Migraines    none in last 4 to 6 months per pt on 05-05-2021   Hyperlipidemia 05/05/2021   no current medication taken   Hypertension    Hypogonadism male    OSA on CPAP    uses cpap does not know settings   Pre-diabetes    checks cbg runs 115 to 118 per pt   Uses brace 05/05/2021   wears right knee brace   Wears contact lenses     Past Surgical History:  Past Surgical History:  Procedure Laterality Date   5 major hand surgeries to right hand age 29     put hand thru coke glass bottle   CARDIAC CATHETERIZATION  03/17/2011   cardiac cath 5/15.   Normal results per patient   colonscopy     6 months ago and age 56 per pt on 05-05-2021   EXAM UNDER ANESTHESIA WITH MANIPULATION OF SHOULDER Left 03/12/2014   Procedure: EXAM UNDER ANESTHESIA WITH MANIPULATION OF SHOULDER;  Surgeon: Johnn Hai, MD;  Location: [facility];  Service: Orthopedics;  Laterality: Left;   KNEE ARTHROSCOPY Bilateral 11/14/1988   KNEE ARTHROSCOPY Right 10/25/2019   Procedure: ARTHROSCOPY KNEE AND DEBRIDEMENT;  Surgeon: Susa Day, MD;  Location: [facility];  Service: Orthopedics;  Laterality: Right;   KNEE ARTHROSCOPY     per pt on 05-05-2021 total of 4 knee  surgeries on 1 side and 5 knee surgeries on other side   KNEE ARTHROSCOPY WITH MEDIAL MENISECTOMY Left 03/24/2013   Procedure: LEFT KNEE ARTHROSCOPY WITH PARTIAL MEDIAL MENISECTOMY, EUA/MUA;  Surgeon: Johnn Hai, MD;  Location: [facility];  Service: Orthopedics;  Laterality: Left;   LAPAROSCOPIC CHOLECYSTECTOMY  01/17/2004   LUMBAR [place] SURGERY  03/16/1998   rigth shoulder rotator cuff repair Right 2016   SHOULDER ARTHROSCOPY WITH ROTATOR CUFF REPAIR AND SUBACROMIAL DECOMPRESSION Left 03/12/2014   Procedure: LEFT SHOULDER ARTHROSCOPY WITH DISTAL CLAVICLE RESECTION/SUBACROMIAL DECOMPRESSION/LABRAL DEBRIDEMENT/ROTATOR CUFF DEBRIDEMENT/SUBACROMIAL RESECTION;  Surgeon: Johnn Hai, MD;  Location: [facility];  Service: Orthopedics;  Laterality: Left;    Medications:  Prior to Admission medications   Medication Sig Start Date End Date Taking? Authorizing Provider  omeprazole (PRILOSEC) 20 MG capsule Take 20 mg by mouth daily.   Yes [provider]  allopurinol (ZYLOPRIM) 300 MG tablet Take 1 tablet (300 mg total) by mouth daily. 12/20/20   Tysinger, Camelia Eng, PA-C  ALPRAZolam Duanne Moron) 0.5 MG tablet Take 1 tablet (0.5 mg total) by mouth at bedtime as needed for anxiety. 10/09/19   Denita Lung, MD  escitalopram (LEXAPRO) 10 MG tablet Take 10 mg by mouth as needed. 05/28/20   [provider]  lisinopril-hydrochlorothiazide (ZESTORETIC) 20-12.5  MG tablet TAKE 2 TABLETS BY MOUTH EVERY DAY 02/13/20   Denita Lung, MD  metFORMIN (GLUCOPHAGE-XR) 750 MG 24 hr tablet TAKE 1 TABLET BY MOUTH EVERY DAY WITH BREAKFAST 04/28/21   Denita Lung, MD  Oxycodone HCl 10 MG TABS Take 10 mg by mouth 4 (four) times daily as needed. 12/16/20   [provider]  sildenafil (REVATIO) 20 MG tablet Take up to 5 pills as needed for erections. Patient taking differently: Take up to 5 pills as needed for erections. Usually takes 1 prn 04/05/18   Denita Lung, MD    Allergies:   Patient has no known allergies.  Review of Systems: Negative except per HPI.  Physical Exam: Alert and oriented, NAD Head and neck: no masses, normal alignment CV: pulse intact Pulm: no increased work of breathing, respirations even and unlabored Abdomen: non-distended Extremities: extremities warm and well perfused  LABS: No results found for this or any previous visit (from the past 2160 hour(s)).   Complete History and Physical exam available in the office notes  [name]

## 2021-05-08 ENCOUNTER — Ambulatory Visit (HOSPITAL_BASED_OUTPATIENT_CLINIC_OR_DEPARTMENT_OTHER): Payer: BC Managed Care – PPO | Admitting: Anesthesiology

## 2021-05-08 ENCOUNTER — Ambulatory Visit (HOSPITAL_BASED_OUTPATIENT_CLINIC_OR_DEPARTMENT_OTHER)
Admission: RE | Admit: 2021-05-08 | Discharge: 2021-05-08 | Disposition: A | Discharge status: Home / Self Care | Payer: BC Managed Care – PPO | Source: Ambulatory Visit | Attending: Orthopedic Surgery | Admitting: Orthopedic Surgery

## 2021-05-08 ENCOUNTER — Encounter (HOSPITAL_BASED_OUTPATIENT_CLINIC_OR_DEPARTMENT_OTHER): Payer: Self-pay | Admitting: Orthopedic Surgery

## 2021-05-08 ENCOUNTER — Encounter (HOSPITAL_BASED_OUTPATIENT_CLINIC_OR_DEPARTMENT_OTHER)
Admission: RE | Disposition: A | Discharge status: Home / Self Care | Payer: Self-pay | Source: Ambulatory Visit | Attending: Orthopedic Surgery

## 2021-05-08 DIAGNOSIS — G5601 Carpal tunnel syndrome, right upper limb: Secondary | ICD-10-CM | POA: Diagnosis not present

## 2021-05-08 DIAGNOSIS — F419 Anxiety disorder, unspecified: Secondary | ICD-10-CM | POA: Diagnosis not present

## 2021-05-08 DIAGNOSIS — I10 Essential (primary) hypertension: Secondary | ICD-10-CM | POA: Diagnosis not present

## 2021-05-08 DIAGNOSIS — Z6839 Body mass index (BMI) 39.0-39.9, adult: Secondary | ICD-10-CM | POA: Diagnosis not present

## 2021-05-08 DIAGNOSIS — R7303 Prediabetes: Secondary | ICD-10-CM | POA: Diagnosis not present

## 2021-05-08 DIAGNOSIS — Z7984 Long term (current) use of oral hypoglycemic drugs: Secondary | ICD-10-CM | POA: Insufficient documentation

## 2021-05-08 DIAGNOSIS — G4733 Obstructive sleep apnea (adult) (pediatric): Secondary | ICD-10-CM | POA: Insufficient documentation

## 2021-05-08 DIAGNOSIS — K219 Gastro-esophageal reflux disease without esophagitis: Secondary | ICD-10-CM | POA: Diagnosis not present

## 2021-05-08 DIAGNOSIS — Z9989 Dependence on other enabling machines and devices: Secondary | ICD-10-CM | POA: Diagnosis not present

## 2021-05-08 HISTORY — DX: Gout, unspecified: M10.9

## 2021-05-08 HISTORY — PX: CARPAL TUNNEL RELEASE: SHX101

## 2021-05-08 LAB — POCT I-STAT, CHEM 8
BUN: 12 mg/dL (ref 6–20)
Calcium, Ion: 1.26 mmol/L (ref 1.15–1.40)
Chloride: 97 mmol/L — ABNORMAL LOW (ref 98–111)
Creatinine, Ser: 1 mg/dL (ref 0.61–1.24)
Glucose, Bld: 141 mg/dL — ABNORMAL HIGH (ref 70–99)
HCT: 45 % (ref 39.0–52.0)
Hemoglobin: 15.3 g/dL (ref 13.0–17.0)
Potassium: 3.8 mmol/L (ref 3.5–5.1)
Sodium: 136 mmol/L (ref 135–145)
TCO2: 27 mmol/L (ref 22–32)

## 2021-05-08 SURGERY — CARPAL TUNNEL RELEASE
Anesthesia: Monitor Anesthesia Care | Site: Wrist | Laterality: Right

## 2021-05-08 MED ORDER — ACETAMINOPHEN 500 MG PO TABS
ORAL_TABLET | ORAL | Status: AC
  Filled 2021-05-08: qty 2

## 2021-05-08 MED ORDER — ONDANSETRON HCL 4 MG/2ML IJ SOLN
INTRAMUSCULAR | Status: DC | PRN
  Administered 2021-05-08: 4 mg via INTRAVENOUS

## 2021-05-08 MED ORDER — ACETAMINOPHEN 500 MG PO TABS
1000.0000 mg | ORAL_TABLET | Freq: Once | ORAL | Status: AC
  Administered 2021-05-08: 1000 mg via ORAL

## 2021-05-08 MED ORDER — MIDAZOLAM HCL 2 MG/2ML IJ SOLN
INTRAMUSCULAR | Status: AC
  Filled 2021-05-08: qty 2

## 2021-05-08 MED ORDER — LIDOCAINE HCL (PF) 1 % IJ SOLN
INTRAMUSCULAR | Status: DC | PRN
  Administered 2021-05-08: 5 mL

## 2021-05-08 MED ORDER — LIDOCAINE HCL (CARDIAC) PF 100 MG/5ML IV SOSY
PREFILLED_SYRINGE | INTRAVENOUS | Status: DC | PRN
Start: 2021-05-08 — End: 2021-05-08
  Administered 2021-05-08: 40 mg via INTRAVENOUS

## 2021-05-08 MED ORDER — LIDOCAINE HCL (PF) 2 % IJ SOLN
INTRAMUSCULAR | Status: AC
  Filled 2021-05-08: qty 5

## 2021-05-08 MED ORDER — KETOROLAC TROMETHAMINE 30 MG/ML IJ SOLN
INTRAMUSCULAR | Status: DC | PRN
Start: 2021-05-08 — End: 2021-05-08
  Administered 2021-05-08: 30 mg via INTRAVENOUS

## 2021-05-08 MED ORDER — 0.9 % SODIUM CHLORIDE (POUR BTL) OPTIME
TOPICAL | Status: DC | PRN
  Administered 2021-05-08: 500 mL

## 2021-05-08 MED ORDER — ONDANSETRON HCL 4 MG/2ML IJ SOLN
INTRAMUSCULAR | Status: AC
  Filled 2021-05-08: qty 2

## 2021-05-08 MED ORDER — LACTATED RINGERS IV SOLN: INTRAVENOUS | Status: DC

## 2021-05-08 MED ORDER — BACITRACIN ZINC 500 UNIT/GM EX OINT
TOPICAL_OINTMENT | CUTANEOUS | Status: DC | PRN
  Administered 2021-05-08: 1 via TOPICAL

## 2021-05-08 MED ORDER — HYDROCODONE-ACETAMINOPHEN 5-325 MG PO TABS: 1.0000 | ORAL_TABLET | Freq: Four times a day (QID) | ORAL | 0 refills | Status: AC | PRN

## 2021-05-08 MED ORDER — PROPOFOL 10 MG/ML IV BOLUS
INTRAVENOUS | Status: DC | PRN
Start: 2021-05-08 — End: 2021-05-08
  Administered 2021-05-08: 40 mg via INTRAVENOUS
  Administered 2021-05-08: 10 mg via INTRAVENOUS

## 2021-05-08 MED ORDER — MIDAZOLAM HCL 5 MG/5ML IJ SOLN
INTRAMUSCULAR | Status: DC | PRN
  Administered 2021-05-08: 2 mg via INTRAVENOUS

## 2021-05-08 MED ORDER — FENTANYL CITRATE (PF) 100 MCG/2ML IJ SOLN
INTRAMUSCULAR | Status: DC | PRN
  Administered 2021-05-08 (×2): 25 ug via INTRAVENOUS
  Administered 2021-05-08: 50 ug via INTRAVENOUS

## 2021-05-08 MED ORDER — FENTANYL CITRATE (PF) 100 MCG/2ML IJ SOLN
INTRAMUSCULAR | Status: AC
Start: 2021-05-08 — End: ?
  Filled 2021-05-08: qty 2

## 2021-05-08 MED ORDER — KETOROLAC TROMETHAMINE 30 MG/ML IJ SOLN
INTRAMUSCULAR | Status: AC
  Filled 2021-05-08: qty 1

## 2021-05-08 MED ORDER — BUPIVACAINE HCL (PF) 0.5 % IJ SOLN
INTRAMUSCULAR | Status: DC | PRN
  Administered 2021-05-08: 5 mL

## 2021-05-08 SURGICAL SUPPLY — 28 items
BLADE SURG 15 STRL LF DISP TIS (BLADE) ×1 IMPLANT
BLADE SURG 15 STRL SS (BLADE) ×2
BNDG CMPR 9X4 STRL LF SNTH (GAUZE/BANDAGES/DRESSINGS) ×1
BNDG ELASTIC 4X5.8 VLCR STR LF (GAUZE/BANDAGES/DRESSINGS) ×2 IMPLANT
BNDG ESMARK 4X9 LF (GAUZE/BANDAGES/DRESSINGS) ×2 IMPLANT
COVER BACK TABLE 60X90IN (DRAPES) ×2 IMPLANT
CUFF TOURN SGL QUICK 18X4 (TOURNIQUET CUFF) ×2 IMPLANT
DRAPE EXTREMITY T 121X128X90 (DISPOSABLE) ×2 IMPLANT
DRSG EMULSION OIL 3X3 NADH (GAUZE/BANDAGES/DRESSINGS) ×2 IMPLANT
GAUZE 4X4 16PLY ~~LOC~~+RFID DBL (SPONGE) ×2 IMPLANT
GAUZE SPONGE 4X4 12PLY STRL (GAUZE/BANDAGES/DRESSINGS) ×2 IMPLANT
GAUZE SPONGE 4X4 12PLY STRL LF (GAUZE/BANDAGES/DRESSINGS) ×1 IMPLANT
GLOVE SURG UNDER POLY LF SZ7.5 (GLOVE) ×2 IMPLANT
GOWN STRL REUS W/TWL LRG LVL3 (GOWN DISPOSABLE) ×2 IMPLANT
HIBICLENS CHG 4% 4OZ BTL (MISCELLANEOUS) ×2 IMPLANT
KIT TURNOVER CYSTO (KITS) ×2 IMPLANT
KNIFE CARPAL TUNNEL (BLADE) ×2 IMPLANT
NEEDLE HYPO 22GX1.5 SAFETY (NEEDLE) ×2 IMPLANT
NS IRRIG 500ML POUR BTL (IV SOLUTION) ×2 IMPLANT
PACK BASIN DAY SURGERY FS (CUSTOM PROCEDURE TRAY) ×2 IMPLANT
PAD CAST 4YDX4 CTTN HI CHSV (CAST SUPPLIES) ×1 IMPLANT
PADDING CAST COTTON 4X4 STRL (CAST SUPPLIES) ×2
SUT ETHILON 4 0 PS 2 18 (SUTURE) ×2 IMPLANT
SYR 10ML LL (SYRINGE) ×2 IMPLANT
SYR BULB EAR ULCER 3OZ GRN STR (SYRINGE) ×2 IMPLANT
TOWEL OR 17X26 10 PK STRL BLUE (TOWEL DISPOSABLE) ×2 IMPLANT
TRAY DSU PREP LF (CUSTOM PROCEDURE TRAY) ×2 IMPLANT
UNDERPAD 30X36 HEAVY ABSORB (UNDERPADS AND DIAPERS) ×2 IMPLANT

## 2021-05-08 NOTE — Anesthesia Preprocedure Evaluation (Addendum)
Anesthesia Evaluation  Patient identified by MRN, date of birth, ID band Patient awake    Reviewed: Allergy & Precautions, NPO status , Patient's Chart, lab work & pertinent test results  History of Anesthesia Complications Negative for: history of anesthetic complications  Airway Mallampati: III  TM Distance: >3 FB Neck ROM: Full    Dental   Pulmonary sleep apnea and Continuous Positive Airway Pressure Ventilation , former smoker,    Pulmonary exam normal        Cardiovascular hypertension, Pt. on medications Normal cardiovascular exam     Neuro/Psych Anxiety negative neurological ROS     GI/Hepatic Neg liver ROS, GERD  ,  Endo/Other  diabetes (preDM), Oral Hypoglycemic AgentsMorbid obesity  Renal/GU negative Renal ROS  negative genitourinary   Musculoskeletal negative musculoskeletal ROS (+)   Abdominal   Peds  Hematology negative hematology ROS (+)   Anesthesia Other Findings   Reproductive/Obstetrics                           Anesthesia Physical Anesthesia Plan  ASA: 3  Anesthesia Plan: MAC   Post-op Pain Management: Tylenol PO (pre-op)* and Toradol IV (intra-op)*   Induction: Intravenous  PONV Risk Score and Plan: 1 and Propofol infusion, TIVA and Treatment may vary due to age or medical condition  Airway Management Planned: Natural Airway, Nasal Cannula and Simple Face Mask  Additional Equipment: None  Intra-op Plan:   Post-operative Plan:   Informed Consent: I have reviewed the patients History and Physical, chart, labs and discussed the procedure including the risks, benefits and alternatives for the proposed anesthesia with the patient or authorized representative who has indicated his/her understanding and acceptance.       Plan Discussed with:   Anesthesia Plan Comments:        Anesthesia Quick Evaluation

## 2021-05-08 NOTE — Transfer of Care (Signed)
Immediate Anesthesia Transfer of Care Note  Patient: [name]  Procedure(s) Performed: CARPAL TUNNEL RELEASE (Right: Wrist)  Patient Location: PACU  Anesthesia Type:MAC  Level of Consciousness: awake, alert  and oriented  Airway & Oxygen Therapy: Patient Spontanous Breathing and Patient connected to nasal cannula oxygen  Post-op Assessment: Report given to RN and Post -op Vital signs reviewed and stable  Post vital signs: Reviewed and stable 2nd stage PACU  Last Vitals:  Vitals Value Taken Time  BP    Temp    Pulse    Resp    SpO2      Last Pain:  Vitals:   05/08/21 1109  TempSrc: Oral  PainSc: 0-No pain      Patients Stated Pain Goal: 5 (57/01/77 9390)  Complications: No notable events documented.

## 2021-05-08 NOTE — Anesthesia Postprocedure Evaluation (Signed)
Anesthesia Post Note  Patient: Gerald Larsen  Procedure(s) Performed: CARPAL TUNNEL RELEASE (Right: Wrist)     Patient location during evaluation: PACU Anesthesia Type: MAC Level of consciousness: awake and alert Pain management: pain level controlled Vital Signs Assessment: post-procedure vital signs reviewed and stable Respiratory status: spontaneous breathing, nonlabored ventilation and respiratory function stable Cardiovascular status: blood pressure returned to baseline and stable Postop Assessment: no apparent nausea or vomiting Anesthetic complications: no   No notable events documented.  Last Vitals:  Vitals:   05/08/21 1222 05/08/21 1259  BP: (!) 142/88 123/71  Pulse: 64 (!) 58  Resp: 16 18  Temp: 36.7 C 36.5 C  SpO2: 96% 94%    Last Pain:  Vitals:   05/08/21 1259  TempSrc:   PainSc: 0-No pain                 Lidia Collum

## 2021-05-08 NOTE — Discharge Instructions (Addendum)
Orthopaedic Hand Surgery Discharge Instructions  WEIGHT BEARING STATUS: Non weight bearing on operative extremity  INCISION CARE: Keep dressing over your incision clean and dry until 5 days after surgery. You may shower by placing a waterproof covering over your dressing. Once dressing is removed, you may allow water to run over the incision and then place Band-Aids over incision. Do not scrub your incision or apply creams/lotions. Do not submerge your incision or swim for 3 weeks after surgery. Contact your surgeon or primary care doctor if you develop redness or drainage from your incision.   PAIN CONTROL: First line medications for post operative pain control are Tylenol (acetaminophen) and Motrin (ibuprofen) if you are able to take these medications. If you have been prescribed a medication these can be taken as breakthrough pain medications. Please note that some narcotic pain medication has acetaminophen added and you should never consume more than 4,000mg  of acetaminophen in 24-hour period. Please note that if you are given Toradol (ketorolac) you should not take similar medications such as ibuprofen or naproxen.  DISCHARGE MEDICATIONS: If you have been prescribed medication it was sent electronically to your pharmacy. No changes have been made to your home medications.  ICE/ELEVATION: Ice and elevate your injured extremity as needed. Avoid direct contact of ice with skin.   BANDAGE FEELS TOO TIGHT: If your bandage feels too tight, first make sure you are elevating your fingers as much as possible. The outer layer of the bandage can be unwrapped and reapplied more loosely. If no improvement, you may carefully cut the inner layer longitudinally until the pressure has resolved and then rewrap the outer layer. If you are not comfortable with these instructions, please call the office and the bandage can be changed for you.   FOLLOW UP: You will be called after surgery with an appointment date and  time, however if you have not received a phone call within 3 days, please call during regular office hours at [phone] to schedule a post operative appointment.  Please Seek Medical Attention if: Call MD for: pain or pressure in chest, jaw, arm, back, neck  Call MD for: temperature greater than 101 F for more than 24 hrs Call MD for: difficulty breathing Call MD for: incision redness, bleeding, drainage  Call MD for: palpitations or feeling that the heart is racing  Call MD for: increased swelling in arm, leg, ankle, or abdomen  Call MD for: lightheadedness, dizziness, fainting Call 911 or go to ER for any medical emergency if you are not able to get in touch with your doctor   J. Sable Feil, MD Orthopaedic Hand Surgeon [facility] Office number: [phone] [address] [place], [place] [address]    No acetaminophen/Tylenol until after 5:15pm today if needed for pain.   No ibuprofen, Advil, Aleve, Motrin, ketorolac, meloxicam, naproxen, or other NSAIDS until after 6:15pm today if needed for pain.      Post Anesthesia Home Care Instructions  Activity: Get plenty of rest for the remainder of the day. A responsible individual must stay with you for 24 hours following the procedure.  For the next 24 hours, DO NOT: -Drive a car -Paediatric nurse -Drink alcoholic beverages -Take any medication unless instructed by your physician -Make any legal decisions or sign important papers.  Meals: Start with liquid foods such as gelatin or soup. Progress to regular foods as tolerated. Avoid greasy, spicy, heavy foods. If nausea and/or vomiting occur, drink only clear liquids until the nausea and/or vomiting  subsides. Call your physician if vomiting continues.  Special Instructions/Symptoms: Your throat may feel dry or sore from the anesthesia or the breathing tube placed in your throat during surgery. If this causes discomfort, gargle with warm salt water. The  discomfort should disappear within 24 hours.

## 2021-05-08 NOTE — Interval H&P Note (Signed)
History and Physical Interval Note:  05/08/2021 11:40 AM  Gerald Larsen  has presented today for surgery, with the diagnosis of Right Carpal Tunnel Syndrome.  The various methods of treatment have been discussed with the patient and family. After consideration of risks, benefits and other options for treatment, the patient has consented to  Procedure(s) with comments: [place] (Right) - with local as a surgical intervention.  The patient's history has been reviewed, patient examined, no change in status, stable for surgery.  I have reviewed the patient's chart and labs.  Questions were answered to the patient's satisfaction.     [name]

## 2021-05-08 NOTE — Op Note (Signed)
OPERATIVE NOTE  DATE OF PROCEDURE: 05/08/2021  SURGEON: Gerald Cadwell, MD  PREOPERATIVE DIAGNOSIS: Right Carpal Tunnel Syndrome  POSTOPERATIVE DIAGNOSIS: Same  NAME OF PROCEDURE: Right Carpal Tunnel Release  ANESTHESIA: Local + MAC  SKIN PREPARATION: Hibiclens  ESTIMATED BLOOD LOSS: Minimal  IMPLANTS: none  INDICATIONS:  Gerald Larsen is a 56 y.o. male who presents with right carpal tunnel syndrome, refractory to nonoperative treatment. The patient has decided to proceed with surgical intervention.  Risks, benefits and alternatives of operative management were discussed including, but not limited to, risks of anesthesia complications, infection, pain, persistent symptoms, stiffness, need for future surgery.  The patient understands, agrees and elects to proceed with surgery.    DESCRIPTION OF PROCEDURE: The patient was placed in the usual supine position and the right upper extremity was prepped and draped in normal sterile fashion.  After local block anesthetic to the right hand and wrist, a standard 1.5 cm incision was made in the midpalm.  This was carried down through the subcutaneous tissues and palmar fascia to the transverse carpal ligament.  The distal one-half of the transverse carpal ligament was incised longitudinally under direct vision using a 15 blade.  The carpal tunnel release guide was then placed under direct vision on the transverse carpal ligament and slid proximally.  The guide was palpated into appropriate alignment longitudinally.  Contact with the transverse carpal ligament was maintained throughout passing.  The blade was engaged into the guide and the remaining portion of the transverse carpal ligament released completely.  No other abnormalities were noted.  The wound was copiously irrigated and the skin closed using horizontal mattress 4-0 nylon sutures.  A light bulky dressing was placed.  The patient tolerated the procedure well and returned to the recovery room in  stable condition.  I was present for the entire surgical procedure.   Gerald Holmes, MD

## 2021-05-09 ENCOUNTER — Encounter (HOSPITAL_BASED_OUTPATIENT_CLINIC_OR_DEPARTMENT_OTHER): Payer: Self-pay | Admitting: Orthopedic Surgery

## 2021-05-12 ENCOUNTER — Other Ambulatory Visit: Payer: Self-pay | Admitting: Medical

## 2021-05-21 DIAGNOSIS — G5601 Carpal tunnel syndrome, right upper limb: Secondary | ICD-10-CM | POA: Diagnosis not present

## 2021-05-25 ENCOUNTER — Other Ambulatory Visit: Payer: Self-pay | Admitting: Family Medicine

## 2021-05-27 DIAGNOSIS — F411 Generalized anxiety disorder: Secondary | ICD-10-CM | POA: Diagnosis not present

## 2021-06-07 ENCOUNTER — Other Ambulatory Visit: Payer: Self-pay | Admitting: Family Medicine

## 2021-07-30 ENCOUNTER — Other Ambulatory Visit: Payer: Self-pay | Admitting: Family Medicine

## 2021-08-01 ENCOUNTER — Ambulatory Visit: Payer: Managed Care, Other (non HMO) | Admitting: Family Medicine

## 2021-08-01 VITALS — BP 152/97 | HR 72 | Temp 97.6°F | Ht 72.0 in | Wt 285.0 lb

## 2021-08-01 DIAGNOSIS — R634 Abnormal weight loss: Secondary | ICD-10-CM

## 2021-08-01 DIAGNOSIS — R112 Nausea with vomiting, unspecified: Secondary | ICD-10-CM

## 2021-08-01 DIAGNOSIS — R197 Diarrhea, unspecified: Secondary | ICD-10-CM

## 2021-08-01 MED ORDER — ONDANSETRON HCL 4 MG PO TABS: 4.0000 mg | ORAL_TABLET | Freq: Three times a day (TID) | ORAL | 0 refills | Status: AC | PRN

## 2021-08-01 NOTE — Progress Notes (Unsigned)
Subjective:     Gerald Larsen is a 56 y.o. male who presents for evaluation of diarrhea. Onset of diarrhea was {1-10:13787} {units:19136::"days"} ago. Diarrhea is occurring approximately {1-12:311357} times per day. Patient describes diarrhea as {desc; diarrhea:14012}. Diarrhea has been associated with {symptoms:14176}. Patient denies {diarrhea denies:14177}. Previous visits for diarrhea: {seen previously:14038}. Evaluation to date: {diarrhea evaluation to date:12373}.  Treatment to date: ***.  {Common ambulatory SmartLinks:19316}  Review of Systems {ros - complete:30496}    Objective:    BP (!) 152/97   Pulse 72   Temp 97.6 F (36.4 C)   Ht 6' (1.829 m)   Wt 285 lb (129.3 kg)   SpO2 96%   BMI 38.65 kg/m  General: {gen appear:16600}  Hydration:  {hydration status:5166::"well hydrated"}  Abdomen:    {abdomen exam:16834}    Assessment:    {diarrhea dx:14049}   Plan:    {diarrhea tx:14050}

## 2021-08-08 ENCOUNTER — Telehealth: Payer: Self-pay | Admitting: Family Medicine

## 2021-08-08 MED ORDER — METFORMIN HCL ER 750 MG PO TB24: ORAL_TABLET | ORAL | 0 refills | Status: DC

## 2021-08-08 NOTE — Telephone Encounter (Signed)
Pt called and states that he needs refills on metformin. Pt informed that according to his chart he transferred out. Pt states he has not changed PCP. Pt states he has been out of medication for months and it keeps getting denied. Pt advised to always call the office if medication gets denied. Pt made an appt for next week. Please send metformin to [facility] in [place].

## 2021-08-13 ENCOUNTER — Ambulatory Visit: Payer: Managed Care, Other (non HMO) | Admitting: Family Medicine

## 2021-08-13 ENCOUNTER — Encounter: Payer: Self-pay | Admitting: Family Medicine

## 2021-08-13 VITALS — BP 130/80 | HR 69 | Wt 284.0 lb

## 2021-08-13 DIAGNOSIS — G4730 Sleep apnea, unspecified: Secondary | ICD-10-CM

## 2021-08-13 DIAGNOSIS — Z6841 Body Mass Index (BMI) 40.0 and over, adult: Secondary | ICD-10-CM | POA: Diagnosis not present

## 2021-08-13 DIAGNOSIS — E291 Testicular hypofunction: Secondary | ICD-10-CM | POA: Diagnosis not present

## 2021-08-13 DIAGNOSIS — M1A9XX0 Chronic gout, unspecified, without tophus (tophi): Secondary | ICD-10-CM

## 2021-08-13 DIAGNOSIS — E785 Hyperlipidemia, unspecified: Secondary | ICD-10-CM

## 2021-08-13 DIAGNOSIS — R197 Diarrhea, unspecified: Secondary | ICD-10-CM | POA: Diagnosis not present

## 2021-08-13 DIAGNOSIS — I10 Essential (primary) hypertension: Secondary | ICD-10-CM

## 2021-08-13 DIAGNOSIS — E1169 Type 2 diabetes mellitus with other specified complication: Secondary | ICD-10-CM

## 2021-08-13 DIAGNOSIS — N521 Erectile dysfunction due to diseases classified elsewhere: Secondary | ICD-10-CM

## 2021-08-13 LAB — POCT GLYCOSYLATED HEMOGLOBIN (HGB A1C): Hemoglobin A1C: 6.7 % — AB (ref 4.0–5.6)

## 2021-08-13 NOTE — Progress Notes (Deleted)
  Subjective:    Patient ID: Gerald Larsen, male    DOB: 16-Aug-1965, 56 y.o.   MRN: [id number]  Gerald Larsen is a 56 y.o. male who presents for follow-up of Type 2 diabetes mellitus.  Home blood sugar records: {diabetes glucometry results:16657} Current symptoms/problems include {Symptoms; diabetes:14075} and have been {Desc; course:15616}. Daily foot checks:   Any foot concerns: *** Exercise: {types:19826} Diet: The following portions of the patient's history were reviewed and updated as appropriate: allergies, current medications, past medical history, past social history and problem list.  ROS as in subjective above.     Objective:    Physical Exam Alert and in no distress otherwise not examined.  Blood pressure 130/80, pulse 69, weight 284 lb (128.8 kg), SpO2 95 %.  Lab Review    Latest Ref Rng & Units 05/08/2021   11:20 AM 12/20/2020   10:47 AM 06/03/2020    3:54 PM 10/23/2019    2:32 PM 07/20/2019   12:08 PM  Diabetic Labs  HbA1c 4.8 - 5.6 %  7.2   7.5   7.0   6.5    Creatinine 0.61 - 1.24 mg/dL 1.00   1.01    0.97         08/13/2021   10:17 AM 08/01/2021    2:03 PM 05/08/2021   12:59 PM 05/08/2021   12:22 PM 05/08/2021   11:09 AM  BP/Weight  Systolic BP 812 751 700 174 944  Diastolic BP 80 97 71 88 99  Wt. (Lbs) 284 285   288.1  BMI 38.52 kg/m2 38.65 kg/m2   39.07 kg/m2      Latest Ref Rng & Units 05/04/2013   12:00 AM 07/21/2011   12:00 AM  Foot/eye exam completion dates  Eye Exam No Retinopathy No Retinopathy      no diabetic retinopathy          This result is from an external source.    [name]  reports that he quit smoking about 32 years ago. His smoking use included cigarettes. He has a 10.00 pack-year smoking history. He has never used smokeless tobacco. He reports current alcohol use. He reports that he does not use drugs.     Assessment & Plan:    No diagnosis found.  Rx changes: {none:33079} Education: Reviewed 'ABCs' of diabetes management  (respective goals in parentheses):  A1C (<7), blood pressure (<130/80), and cholesterol (LDL <100). Compliance at present is estimated to be {good/fair/poor:33178}. Efforts to improve compliance (if necessary) will be directed at {compliance:16716}. Follow up: {NUMBERS; 0-10:33138} {time:11}

## 2021-08-13 NOTE — Progress Notes (Signed)
Subjective:    Patient ID: Gerald Larsen, male    DOB: 03-31-1965, 56 y.o.   MRN: [id number]  Gerald Larsen is a 56 y.o. male who presents for follow-up of Type 2 diabetes mellitus.  Patient is checking home blood sugars.   Home blood sugar records:  in range in the am when checked  How often is blood sugars being checked: once a  week Current symptoms/problems include none and have been stable. Daily foot checks: yes   Any foot concerns: none at this time  Last eye exam: Last year   Exercise:  staying active  He recently lost his job but he is now has coverage under his wife's insurance.  He was recently seen by another physician and apparently told that he has alpha gal although I do not see any information in the record.  He did run out of metformin but has been back on this for roughly 1 month.  He also has OSA and is on CPAP.  He states that the machine gives him greenlight information saying he is doing a good job.  He does complain of difficulty with anhedonia.  Does have a previous history of hypogonadism but presently is not on medications.  He continues on his allopurinol for his gout.  Lexapro is working well with an occasional Xanax to help psychologically.  He is taking lisinopril/HCTZ. The following portions of the patient's history were reviewed and updated as appropriate: allergies, current medications, past medical history, past social history and problem list.  ROS as in subjective above.     Objective:    Physical Exam Alert and in no distress otherwise not examined.  Blood pressure 130/80, pulse 69, weight 284 lb (128.8 kg), SpO2 95 %.  Lab Review    Latest Ref Rng & Units 05/08/2021   11:20 AM 12/20/2020   10:47 AM 06/03/2020    3:54 PM 10/23/2019    2:32 PM 07/20/2019   12:08 PM  Diabetic Labs  HbA1c 4.8 - 5.6 %  7.2   7.5   7.0   6.5    Creatinine 0.61 - 1.24 mg/dL 1.00   1.01    0.97         08/13/2021   10:17 AM 08/01/2021    2:03 PM 05/08/2021   12:59  PM 05/08/2021   12:22 PM 05/08/2021   11:09 AM  BP/Weight  Systolic BP 725 366 440 347 425  Diastolic BP 80 97 71 88 99  Wt. (Lbs) 284 285   288.1  BMI 38.52 kg/m2 38.65 kg/m2   39.07 kg/m2      Latest Ref Rng & Units 05/04/2013   12:00 AM 07/21/2011   12:00 AM  Foot/eye exam completion dates  Eye Exam No Retinopathy No Retinopathy      no diabetic retinopathy          This result is from an external source.  Hemoglobin A1c is 6.7  Taten  reports that he quit smoking about 32 years ago. His smoking use included cigarettes. He has a 10.00 pack-year smoking history. He has never used smokeless tobacco. He reports current alcohol use. He reports that he does not use drugs.     Assessment & Plan:    Diarrhea, unspecified type  Hypogonadism in male - Plan: Testosterone  Chronic gout without tophus, unspecified cause, unspecified site - Plan: Comprehensive metabolic panel, Uric Acid  BMI 40.0-44.9, adult (HCC)  Type 2 diabetes mellitus with other specified complication,  without long-term current use of insulin ([place]) - Plan: POCT glycosylated hemoglobin (Hb A1C), Lipid panel, Comprehensive metabolic panel  Primary hypertension  Sleep apnea, unspecified type  Erectile dysfunction due to diseases classified elsewhere  Hyperlipidemia associated with type 2 diabetes mellitus ([place]) - Plan: Lipid panel Overall he seems to doing quite nicely.  I will follow-up on the testosterone as well as continue on his other medications.

## 2021-08-14 LAB — COMPREHENSIVE METABOLIC PANEL
ALT: 58 IU/L — ABNORMAL HIGH (ref 0–44)
AST: 91 IU/L — ABNORMAL HIGH (ref 0–40)
Albumin/Globulin Ratio: 1.4 (ref 1.2–2.2)
Albumin: 4.3 g/dL (ref 3.8–4.9)
Alkaline Phosphatase: 97 IU/L (ref 44–121)
BUN/Creatinine Ratio: 11 (ref 9–20)
BUN: 9 mg/dL (ref 6–24)
Bilirubin Total: 0.6 mg/dL (ref 0.0–1.2)
CO2: 22 mmol/L (ref 20–29)
Calcium: 9.7 mg/dL (ref 8.7–10.2)
Chloride: 96 mmol/L (ref 96–106)
Creatinine, Ser: 0.84 mg/dL (ref 0.76–1.27)
Globulin, Total: 3.1 g/dL (ref 1.5–4.5)
Glucose: 159 mg/dL — ABNORMAL HIGH (ref 70–99)
Potassium: 3.9 mmol/L (ref 3.5–5.2)
Sodium: 135 mmol/L (ref 134–144)
Total Protein: 7.4 g/dL (ref 6.0–8.5)
eGFR: 102 mL/min/{1.73_m2} (ref >59)

## 2021-08-14 LAB — LIPID PANEL
Chol/HDL Ratio: 4.4 ratio (ref 0.0–5.0)
Cholesterol, Total: 192 mg/dL (ref 100–199)
HDL: 44 mg/dL (ref >39)
LDL Chol Calc (NIH): 116 mg/dL — ABNORMAL HIGH (ref 0–99)
Triglycerides: 181 mg/dL — ABNORMAL HIGH (ref 0–149)
VLDL Cholesterol Cal: 32 mg/dL (ref 5–40)

## 2021-08-14 LAB — URIC ACID: Uric Acid: 6.7 mg/dL (ref 3.8–8.4)

## 2021-08-14 LAB — TESTOSTERONE: Testosterone: 401 ng/dL (ref 264–916)

## 2021-08-19 ENCOUNTER — Telehealth: Payer: Self-pay

## 2021-08-19 DIAGNOSIS — G8929 Other chronic pain: Secondary | ICD-10-CM

## 2021-08-19 MED ORDER — TRAMADOL HCL 50 MG PO TABS
50.0000 mg | ORAL_TABLET | Freq: Three times a day (TID) | ORAL | 0 refills | Status: AC | PRN
Start: 2021-08-19 — End: 2021-09-18

## 2021-08-19 NOTE — Telephone Encounter (Signed)
Will transition to new med for gout related pain. If effective pt needs to be on tight and then quarterly monitoring.  Elwin Mocha, MD

## 2021-09-01 ENCOUNTER — Other Ambulatory Visit: Payer: Self-pay | Admitting: Medical

## 2021-09-14 ENCOUNTER — Other Ambulatory Visit: Payer: Self-pay | Admitting: Family Medicine

## 2021-09-18 ENCOUNTER — Encounter: Payer: Self-pay | Admitting: Family Medicine

## 2021-09-18 ENCOUNTER — Other Ambulatory Visit: Payer: Self-pay | Admitting: Family Medicine

## 2021-09-27 ENCOUNTER — Other Ambulatory Visit: Payer: Self-pay | Admitting: Family Medicine

## 2021-09-27 DIAGNOSIS — I10 Essential (primary) hypertension: Secondary | ICD-10-CM

## 2021-10-13 ENCOUNTER — Other Ambulatory Visit: Payer: Self-pay | Admitting: Family Medicine

## 2021-10-13 DIAGNOSIS — G8929 Other chronic pain: Secondary | ICD-10-CM

## 2021-11-19 ENCOUNTER — Encounter: Payer: Self-pay | Admitting: Internal Medicine

## 2021-12-09 ENCOUNTER — Ambulatory Visit: Payer: Managed Care, Other (non HMO) | Admitting: Family Medicine

## 2021-12-16 ENCOUNTER — Other Ambulatory Visit (HOSPITAL_COMMUNITY): Payer: Self-pay | Admitting: Psychiatry

## 2021-12-16 DIAGNOSIS — F411 Generalized anxiety disorder: Secondary | ICD-10-CM

## 2021-12-18 ENCOUNTER — Encounter: Payer: Self-pay | Admitting: Family Medicine

## 2021-12-23 ENCOUNTER — Other Ambulatory Visit: Payer: Self-pay | Admitting: Family Medicine

## 2021-12-23 ENCOUNTER — Encounter: Payer: Self-pay | Admitting: Internal Medicine

## 2022-01-05 ENCOUNTER — Encounter: Payer: Self-pay | Admitting: Internal Medicine

## 2022-01-30 ENCOUNTER — Encounter: Payer: Self-pay | Admitting: Family Medicine

## 2022-06-26 ENCOUNTER — Other Ambulatory Visit: Payer: Self-pay | Admitting: Family Medicine

## 2022-06-26 NOTE — Telephone Encounter (Signed)
Left message for pt to call back to schedule a visit. He is overdue  

## 2023-05-11 ENCOUNTER — Encounter: Payer: Self-pay | Admitting: Internal Medicine

## 2024-02-22 ENCOUNTER — Ambulatory Visit: Admitting: Family Medicine

## 2024-02-22 ENCOUNTER — Encounter: Payer: Self-pay | Admitting: Family Medicine

## 2024-02-22 VITALS — BP 154/98 | HR 120 | Wt 238.2 lb

## 2024-02-22 DIAGNOSIS — R634 Abnormal weight loss: Secondary | ICD-10-CM

## 2024-02-22 DIAGNOSIS — Z91014 Allergy to mammalian meats: Secondary | ICD-10-CM | POA: Diagnosis not present

## 2024-02-22 DIAGNOSIS — R112 Nausea with vomiting, unspecified: Secondary | ICD-10-CM | POA: Diagnosis not present

## 2024-02-22 DIAGNOSIS — R188 Other ascites: Secondary | ICD-10-CM | POA: Diagnosis not present

## 2024-02-22 LAB — LIPID PANEL

## 2024-02-22 NOTE — Progress Notes (Signed)
   Subjective:    Patient ID: Gerald Larsen, male    DOB: 07-13-1965, 58 y.o.   MRN: [id number]  Discussed the use of AI scribe software for clinical note transcription with the patient, who gave verbal consent to proceed.  History of Present Illness   Gerald Larsen is a 58 year old male who presents with chronic vomiting, diarrhea, and recent abdominal distension.  He has been experiencing vomiting and diarrhea for the past two years, leading to significant weight loss of approximately 100 pounds. He avoids eating due to the fear of getting sick, sometimes not eating for two to three days at a time. When he does eat, he experiences both vomiting and diarrhea, describing the diarrhea as watery. No specific food triggers are identified.  Approximately two to three weeks ago, he noticed his abdomen began to swell. He describes waking up one morning with a significantly distended abdomen, initially attributing it to gas.  His weight has decreased from 290 pounds in 2022 to 238 pounds currently. He describes the diarrhea as watery and notes that he would need to find a bathroom quickly or risk incontinence.  He consumes a lot of Cokes and a small amount of alcohol, approximately three small wine drinks per week.     He was also diagnosed with alpha gal approximately 1 year ago.  He did not get any follow-up with that.      Review of Systems     Objective:    Physical Exam Alert and in no distress.  Sclera are questionably icteric.  Tympanic membranes and canals are normal. Pharyngeal area is normal. Neck is supple without adenopathy or thyromegaly. Cardiac exam shows a regular sinus rhythm with a grade 2 systolic murmur as well as tachycardia murmurs or gallops. Lungs are clear to auscultation. Abdominal exam shows active bowel sounds with a rather large abdomen fluid level was noted.  I could not appreciate the size of his liver.  Percussion was difficult.     Assessment &  Plan:  Assessment and Plan    Ascites Acute ascites with abdominal distension. Differential includes liver pathology. Further investigation required. - Ordered blood work for liver function and other potential causes.  Chronic vomiting and diarrhea with weight loss Chronic vomiting and diarrhea for two years with 100-pound weight loss. No specific food triggers identified. - Ordered blood work to evaluate underlying causes.      Alpha-gal syndrome  Weight loss  Nausea and vomiting, unspecified vomiting type Assessment and Plan    Chronic vomiting and diarrhea with weight loss Chronic vomiting and diarrhea for two years with 100-pound weight loss. No specific food triggers identified. - Ordered blood work to evaluate underlying causes.      Greater than 45 minutes was spent discussing his condition with him and his wife.

## 2024-02-23 ENCOUNTER — Ambulatory Visit
Admission: RE | Admit: 2024-02-23 | Discharge: 2024-02-23 | Disposition: A | Discharge status: Home / Self Care | Source: Ambulatory Visit | Attending: Family Medicine | Admitting: Family Medicine

## 2024-02-23 ENCOUNTER — Ambulatory Visit: Payer: Self-pay | Admitting: Family Medicine

## 2024-02-23 DIAGNOSIS — R188 Other ascites: Secondary | ICD-10-CM

## 2024-02-23 LAB — AMMONIA: Ammonia: 82 ug/dL (ref 40–200)

## 2024-02-23 LAB — CBC WITH DIFFERENTIAL/PLATELET
Basophils Absolute: 0.1 x10E3/uL (ref 0.0–0.2)
Basos: 1 %
EOS (ABSOLUTE): 0.1 x10E3/uL (ref 0.0–0.4)
Eos: 1 %
Hematocrit: 41.5 % (ref 37.5–51.0)
Hemoglobin: 14.3 g/dL (ref 13.0–17.7)
Immature Grans (Abs): 0 x10E3/uL (ref 0.0–0.1)
Immature Granulocytes: 0 %
Lymphocytes Absolute: 2 x10E3/uL (ref 0.7–3.1)
Lymphs: 24 %
MCH: 32.7 pg (ref 26.6–33.0)
MCHC: 34.5 g/dL (ref 31.5–35.7)
MCV: 95 fL (ref 79–97)
Monocytes Absolute: 0.6 x10E3/uL (ref 0.1–0.9)
Monocytes: 8 %
Neutrophils Absolute: 5.3 x10E3/uL (ref 1.4–7.0)
Neutrophils: 66 %
Platelets: 181 x10E3/uL (ref 150–450)
RBC: 4.37 x10E6/uL (ref 4.14–5.80)
RDW: 13.9 % (ref 11.6–15.4)
WBC: 8.1 x10E3/uL (ref 3.4–10.8)

## 2024-02-23 LAB — COMPREHENSIVE METABOLIC PANEL WITH GFR
ALT: 48 IU/L — AB (ref 0–44)
AST: 91 IU/L — AB (ref 0–40)
Albumin: 3.1 g/dL — AB (ref 3.8–4.9)
Alkaline Phosphatase: 158 IU/L — AB (ref 47–123)
BUN/Creatinine Ratio: 12 (ref 10–24)
BUN: 11 mg/dL (ref 8–27)
Bilirubin Total: 3.8 mg/dL — AB (ref 0.0–1.2)
CO2: 23 mmol/L (ref 20–29)
Calcium: 9 mg/dL (ref 8.6–10.2)
Chloride: 101 mmol/L (ref 96–106)
Creatinine, Ser: 0.94 mg/dL (ref 0.76–1.27)
Globulin, Total: 3.6 g/dL (ref 1.5–4.5)
Glucose: 120 mg/dL — AB (ref 70–99)
Potassium: 3.6 mmol/L (ref 3.5–5.2)
Sodium: 140 mmol/L (ref 134–144)
Total Protein: 6.7 g/dL (ref 6.0–8.5)
eGFR: 93 mL/min/1.73 (ref >59)

## 2024-02-23 LAB — LIPID PANEL
Cholesterol, Total: 149 mg/dL (ref 100–199)
HDL: 59 mg/dL (ref >39)
LDL CALC COMMENT:: 2.5 ratio (ref 0.0–5.0)
LDL Chol Calc (NIH): 75 mg/dL (ref 0–99)
Triglycerides: 77 mg/dL (ref 0–149)
VLDL Cholesterol Cal: 15 mg/dL (ref 5–40)

## 2024-02-23 MED ORDER — IOPAMIDOL (ISOVUE-300) INJECTION 61%
100.0000 mL | Freq: Once | INTRAVENOUS | Status: AC | PRN
  Administered 2024-02-23: 15:00:00 100 mL via INTRAVENOUS

## 2024-02-24 ENCOUNTER — Other Ambulatory Visit: Payer: Self-pay

## 2024-02-24 ENCOUNTER — Other Ambulatory Visit: Payer: Self-pay | Admitting: Family Medicine

## 2024-02-24 DIAGNOSIS — R188 Other ascites: Secondary | ICD-10-CM

## 2024-02-24 DIAGNOSIS — I10 Essential (primary) hypertension: Secondary | ICD-10-CM

## 2024-02-24 DIAGNOSIS — K7460 Unspecified cirrhosis of liver: Secondary | ICD-10-CM

## 2024-02-24 MED ORDER — FUROSEMIDE 40 MG PO TABS: 40.0000 mg | ORAL_TABLET | Freq: Every day | ORAL | 3 refills | Status: AC

## 2024-02-24 MED ORDER — SPIRONOLACTONE 50 MG PO TABS: 50.0000 mg | ORAL_TABLET | Freq: Two times a day (BID) | ORAL | 5 refills | Status: AC

## 2024-02-24 NOTE — Progress Notes (Signed)
 They are working on referral now, Called Pt and let him know.

## 2024-02-24 NOTE — Progress Notes (Signed)
 Scheduled to be seen Monday by Dr Stacia. I will start spironolactone and lasix. He is told  to decrease fliuds and salt

## 2024-02-28 ENCOUNTER — Encounter: Payer: Self-pay | Admitting: Gastroenterology

## 2024-02-28 ENCOUNTER — Ambulatory Visit: Admitting: Gastroenterology

## 2024-02-28 ENCOUNTER — Other Ambulatory Visit

## 2024-02-28 VITALS — BP 130/88 | HR 80 | Ht 72.0 in | Wt 228.0 lb

## 2024-02-28 DIAGNOSIS — K529 Noninfective gastroenteritis and colitis, unspecified: Secondary | ICD-10-CM

## 2024-02-28 DIAGNOSIS — Z91014 Allergy to mammalian meats: Secondary | ICD-10-CM | POA: Diagnosis not present

## 2024-02-28 DIAGNOSIS — R188 Other ascites: Secondary | ICD-10-CM

## 2024-02-28 DIAGNOSIS — R634 Abnormal weight loss: Secondary | ICD-10-CM | POA: Diagnosis not present

## 2024-02-28 DIAGNOSIS — K7460 Unspecified cirrhosis of liver: Secondary | ICD-10-CM

## 2024-02-28 DIAGNOSIS — K219 Gastro-esophageal reflux disease without esophagitis: Secondary | ICD-10-CM

## 2024-02-28 LAB — IBC + FERRITIN
Ferritin: 61.7 ng/mL (ref 22.0–322.0)
Iron: 53 ug/dL (ref 42–165)
Saturation Ratios: 16.8 % — ABNORMAL LOW (ref 20.0–50.0)
TIBC: 315 ug/dL (ref 250.0–450.0)
Transferrin: 225 mg/dL (ref 212.0–360.0)

## 2024-02-28 LAB — COMPREHENSIVE METABOLIC PANEL WITH GFR
ALT: 38 U/L (ref 0–53)
AST: 76 U/L — ABNORMAL HIGH (ref 0–37)
Albumin: 3.5 g/dL (ref 3.5–5.2)
Alkaline Phosphatase: 144 U/L — ABNORMAL HIGH (ref 39–117)
BUN: 8 mg/dL (ref 6–23)
CO2: 34 meq/L — ABNORMAL HIGH (ref 19–32)
Calcium: 9.5 mg/dL (ref 8.4–10.5)
Chloride: 93 meq/L — ABNORMAL LOW (ref 96–112)
Creatinine, Ser: 0.87 mg/dL (ref 0.40–1.50)
GFR: 94.01 mL/min (ref >60.00)
Glucose, Bld: 104 mg/dL — ABNORMAL HIGH (ref 70–99)
Potassium: 3.2 meq/L — ABNORMAL LOW (ref 3.5–5.1)
Sodium: 136 meq/L (ref 135–145)
Total Bilirubin: 3.6 mg/dL — ABNORMAL HIGH (ref 0.2–1.2)
Total Protein: 7.5 g/dL (ref 6.0–8.3)

## 2024-02-28 LAB — GAMMA GT: GGT: 133 U/L — ABNORMAL HIGH (ref 7–51)

## 2024-02-28 LAB — PROTIME-INR
INR: 1.7 ratio — ABNORMAL HIGH (ref 0.8–1.0)
Prothrombin Time: 17.1 s — ABNORMAL HIGH (ref 9.6–13.1)

## 2024-02-28 LAB — VITAMIN D 25 HYDROXY (VIT D DEFICIENCY, FRACTURES): VITD: 28.28 ng/mL — ABNORMAL LOW (ref 30.00–100.00)

## 2024-02-28 LAB — VITAMIN B12: Vitamin B-12: 484 pg/mL (ref 211–911)

## 2024-02-28 LAB — FOLATE: Folate: 7.8 ng/mL (ref >5.9)

## 2024-02-28 NOTE — Progress Notes (Unsigned)
 HPI :    3-4 weeks of abdominal swelling        CT Abdomen/pelvis Feb 23, 2024   IMPRESSION: 1. Cirrhosis, with portal venous hypertension manifested by splenomegaly, recanalization of the umbilical vein, and upper abdominal varices. 2. Large volume ascites. 3. Punctate nonobstructing left renal calculi. 4.  Aortic Atherosclerosis (ICD10-I70.0)   CT Abdomen/pelvis Oct 2022  IMPRESSION: 1. No acute CT findings of the abdomen or pelvis to explain abdominal pain, nausea, or diarrhea. 2. Hepatic steatosis. 3. Status post cholecystectomy    Colonoscopy Nov 2022 (Dr. Rollin) 2 tubular adenomas (size unknown) Normal random colon biopsies  Past Medical History:  Diagnosis Date   Acute meniscal tear of left knee    Anxiety    Arthritis    oa   Chipped tooth 05/05/2021   1 on front upper 1 on front lower per pt   GERD (gastroesophageal reflux disease)    Gout    last flare up 04-28-2021   history of Migraines    none in last 4 to 6 months per pt on 05-05-2021   Hyperlipidemia 05/05/2021   no current medication taken   Hypertension    Hypogonadism male    OSA on CPAP    uses cpap does not know settings   Pre-diabetes    checks cbg runs 115 to 118 per pt   Uses brace 05/05/2021   wears right knee brace   Wears contact lenses      Past Surgical History:  Procedure Laterality Date   5 major hand surgeries to right hand age 51     put hand thru coke glass bottle   CARDIAC CATHETERIZATION  03/17/2011   cardiac cath 5/15.   Normal results per patient   CARPAL TUNNEL RELEASE Right 05/08/2021   Procedure: CARPAL TUNNEL RELEASE;  Surgeon: Alyse Agent, MD;  Location: [facility];  Service: Orthopedics;  Laterality: Right;  with local   colonscopy     6 months ago and age 75 per pt on 05-05-2021   EXAM UNDER ANESTHESIA WITH MANIPULATION OF SHOULDER Left 03/12/2014   Procedure: EXAM UNDER ANESTHESIA WITH MANIPULATION OF SHOULDER;  Surgeon: Reyes JAYSON Billing, MD;  Location: [facility];  Service: Orthopedics;  Laterality: Left;   KNEE ARTHROSCOPY Bilateral 11/14/1988   KNEE ARTHROSCOPY Right 10/25/2019   Procedure: ARTHROSCOPY KNEE AND DEBRIDEMENT;  Surgeon: Billing Reyes, MD;  Location: [facility];  Service: Orthopedics;  Laterality: Right;   KNEE ARTHROSCOPY     per pt on 05-05-2021 total of 4 knee surgeries on 1 side and 5 knee surgeries on other side   KNEE ARTHROSCOPY WITH MEDIAL MENISECTOMY Left 03/24/2013   Procedure: LEFT KNEE ARTHROSCOPY WITH PARTIAL MEDIAL MENISECTOMY, EUA/MUA;  Surgeon: Reyes JAYSON Billing, MD;  Location:  SURGERY CENTER;  Service: Orthopedics;  Laterality: Left;   LAPAROSCOPIC CHOLECYSTECTOMY  01/17/2004   LUMBAR DISC SURGERY  03/16/1998   rigth shoulder rotator cuff repair Right 2016   SHOULDER ARTHROSCOPY WITH ROTATOR CUFF REPAIR AND SUBACROMIAL DECOMPRESSION Left 03/12/2014   Procedure: LEFT SHOULDER ARTHROSCOPY WITH DISTAL CLAVICLE RESECTION/SUBACROMIAL DECOMPRESSION/LABRAL DEBRIDEMENT/ROTATOR CUFF DEBRIDEMENT/SUBACROMIAL RESECTION;  Surgeon: Reyes JAYSON Billing, MD;  Location: [facility];  Service: Orthopedics;  Laterality: Left;   Family History  Problem Relation Age of Onset   Arthritis Mother    Heart disease Mother    Diabetes Mother    Depression Mother    Arthritis Father    Heart disease Father    Colon cancer  Neg Hx    Social History[1] Current Outpatient Medications  Medication Sig Dispense Refill   furosemide  (LASIX ) 40 MG tablet Take 1 tablet (40 mg total) by mouth daily. 30 tablet 3   spironolactone  (ALDACTONE ) 50 MG tablet Take 1 tablet (50 mg total) by mouth 2 (two) times daily. 60 tablet 5   No current facility-administered medications for this visit.   Allergies[2]   Review of Systems: All systems reviewed and negative except where noted in HPI.    CT ABDOMEN PELVIS W CONTRAST Result Date: 02/23/2024 CLINICAL DATA:  Ascites, abdominal tightness and pain EXAM: CT ABDOMEN AND PELVIS  WITH CONTRAST TECHNIQUE: Multidetector CT imaging of the abdomen and pelvis was performed using the standard protocol following bolus administration of intravenous contrast. RADIATION DOSE REDUCTION: This exam was performed according to the departmental dose-optimization program which includes automated exposure control, adjustment of the mA and/or kV according to patient size and/or use of iterative reconstruction technique. CONTRAST:  100mL ISOVUE -300 IOPAMIDOL  (ISOVUE -300) INJECTION 61% COMPARISON:  12/26/2020 FINDINGS: Lower chest: No acute pleural or parenchymal lung disease. Hepatobiliary: Shrunken nodular appearance of the liver compatible cirrhosis. Small right lobe liver cyst. No other focal abnormalities identified. Prior cholecystectomy. No biliary duct dilation. Pancreas: Unremarkable. No pancreatic ductal dilatation or surrounding inflammatory changes. Spleen: The spleen is enlarged, measuring 16.7 x 7.5 x 12.5 cm. No focal parenchymal abnormality. Adrenals/Urinary Tract: There are punctate nonobstructing less than 3 mm calculi within the lower pole left kidney. No right-sided calculi. No obstructive uropathy within either kidney. Bladder is decompressed, limiting its evaluation. The adrenals are unremarkable. Stomach/Bowel: No bowel obstruction or ileus. Normal appendix right lower quadrant. No bowel wall thickening or inflammatory change. Vascular/Lymphatic: Minimal aortic atherosclerosis. Evidence of portal venous hypertension, with upper abdominal varices and recanalization of the umbilical vein. No pathologic adenopathy. Reproductive: Prostate is unremarkable. Other: Large volume ascites throughout the abdomen and pelvis. No free intraperitoneal gas. No abdominal wall hernia. Musculoskeletal: No acute or destructive bony abnormalities. Reconstructed images demonstrate no additional findings. IMPRESSION: 1. Cirrhosis, with portal venous hypertension manifested by splenomegaly, recanalization of the  umbilical vein, and upper abdominal varices. 2. Large volume ascites. 3. Punctate nonobstructing left renal calculi. 4.  Aortic Atherosclerosis (ICD10-I70.0). Electronically Signed   By: Ozell Daring M.D.   On: 02/23/2024 15:37    Physical Exam: There were no vitals taken for this visit. Constitutional: Pleasant,well-developed, ***male in no acute distress. HEENT: Normocephalic and atraumatic. Conjunctivae are normal. No scleral icterus. Neck supple.  Cardiovascular: Normal rate, regular rhythm.  Pulmonary/chest: Effort normal and breath sounds normal. No wheezing, rales or rhonchi. Abdominal: Soft, nondistended, nontender. Bowel sounds active throughout. There are no masses palpable. No hepatomegaly. Extremities: no edema Lymphadenopathy: No cervical adenopathy noted. Neurological: Alert and oriented to person place and time. Skin: Skin is warm and dry. No rashes noted. Psychiatric: Normal mood and affect. Behavior is normal.  CBC    Component Value Date/Time   WBC 8.1 02/22/2024 1140   WBC 7.2 10/23/2019 1432   RBC 4.37 02/22/2024 1140   RBC 5.18 10/23/2019 1432   HGB 14.3 02/22/2024 1140   HCT 41.5 02/22/2024 1140   PLT 181 02/22/2024 1140   MCV 95 02/22/2024 1140   MCV 88 07/13/2013 0752   MCH 32.7 02/22/2024 1140   MCH 29.7 10/23/2019 1432   MCHC 34.5 02/22/2024 1140   MCHC 33.1 10/23/2019 1432   RDW 13.9 02/22/2024 1140   RDW 14.8 (H) 07/13/2013 0752   LYMPHSABS 2.0 02/22/2024  1140   MONOABS 276 10/14/2016 1127   EOSABS 0.1 02/22/2024 1140   BASOSABS 0.1 02/22/2024 1140    CMP     Component Value Date/Time   NA 140 02/22/2024 1140   NA 140 07/14/2013 0457   K 3.6 02/22/2024 1140   K 4.0 07/14/2013 0457   CL 101 02/22/2024 1140   CL 109 (H) 07/14/2013 0457   CO2 23 02/22/2024 1140   CO2 28 07/14/2013 0457   GLUCOSE 120 (H) 02/22/2024 1140   GLUCOSE 141 (H) 05/08/2021 1120   GLUCOSE 124 (H) 07/14/2013 0457   BUN 11 02/22/2024 1140   BUN 14 07/14/2013  0457   CREATININE 0.94 02/22/2024 1140   CREATININE 0.97 10/14/2016 1127   CALCIUM 9.0 02/22/2024 1140   CALCIUM 8.9 07/14/2013 0457   PROT 6.7 02/22/2024 1140   PROT 7.2 07/13/2013 0752   ALBUMIN 3.1 (L) 02/22/2024 1140   ALBUMIN 3.8 07/13/2013 0752   AST 91 (H) 02/22/2024 1140   AST 45 (H) 07/13/2013 0752   ALT 48 (H) 02/22/2024 1140   ALT 60 07/13/2013 0752   ALKPHOS 158 (H) 02/22/2024 1140   ALKPHOS 71 07/13/2013 0752   BILITOT 3.8 (H) 02/22/2024 1140   BILITOT 0.4 07/13/2013 0752   GFRNONAA >60 10/23/2019 1432   GFRNONAA >60 07/14/2013 0457   GFRAA >60 10/23/2019 1432   GFRAA >60 07/14/2013 0457       Latest Ref Rng & Units 02/22/2024   11:40 AM 05/08/2021   11:20 AM 12/20/2020   10:47 AM  CBC EXTENDED  WBC 3.4 - 10.8 x10E3/uL 8.1   5.8   RBC 4.14 - 5.80 x10E6/uL 4.37   5.00   Hemoglobin 13.0 - 17.7 g/dL 85.6  84.6  85.1   HCT 37.5 - 51.0 % 41.5  45.0  44.9   Platelets 150 - 450 x10E3/uL 181   206   NEUT# 1.4 - 7.0 x10E3/uL 5.3   3.6   Lymph# 0.7 - 3.1 x10E3/uL 2.0   1.6        ASSESSMENT AND PLAN:    Cirrhosis with ascites - IR for diagnostic/therapeutic paracentesis - Serologies for viral/autoimmune/genetic liver disease - AFP - lasix /aldactone  - low sodium diet, high protein diet - EGD for variceal screening  Diarrhea - Fecal elastase, calprotectin - TTG/IgA -   Weight loss - Prealbumin   Gerald Norleen BROCKS, MD     [1]  Social History Tobacco Use   Smoking status: Former    Current packs/day: 0.00    Average packs/day: 1 pack/day for 10.0 years (10.0 ttl pk-yrs)    Types: Cigarettes    Start date: 10/28/1978    Quit date: 10/27/1988    Years since quitting: 35.3   Smokeless tobacco: Never  Vaping Use   Vaping status: Never Used  Substance Use Topics   Alcohol use: Yes    Comment: rare   Drug use: No    Comment: hx of marijuana use as a teenager   [2] No Known Allergies

## 2024-02-28 NOTE — Patient Instructions (Signed)
 Your provider has requested that you go to the basement level for lab work before leaving today. Press B on the elevator. The lab is located at the first door on the left as you exit the elevator.  Remain on a low sodium diet with less then 2 grams/daily  Remain on a high protein diet with more then 60 grams/daily.   Avoid all red meat and dairy.  You have been scheduled for an abdominal paracentesis at [facility] radiology on 03/02/24 at 9:00am. Please arrive at least 30 minutes prior to your appointment time for registration. Should you need to reschedule this appointment for any reason, please call our office at [phone].  You have been scheduled for an endoscopy. Please follow written instructions given to you at your visit today.  If you use inhalers (even only as needed), please bring them with you on the day of your procedure.  If you take any of the following medications, they will need to be adjusted prior to your procedure:   DO NOT TAKE 7 DAYS PRIOR TO TEST- Trulicity (dulaglutide) Ozempic, Wegovy (semaglutide) Mounjaro, Zepbound (tirzepatide) Bydureon Bcise (exanatide extended release)  DO NOT TAKE 1 DAY PRIOR TO YOUR TEST Rybelsus (semaglutide) Adlyxin (lixisenatide) Victoza (liraglutide) Byetta (exanatide) _____________________________________________________________

## 2024-03-01 LAB — SPECIMEN STATUS REPORT

## 2024-03-01 LAB — HGB A1C W/O EAG: Hgb A1c MFr Bld: 4.8 % (ref 4.8–5.6)

## 2024-03-02 ENCOUNTER — Ambulatory Visit (HOSPITAL_COMMUNITY)
Admission: RE | Admit: 2024-03-02 | Discharge: 2024-03-02 | Disposition: A | Discharge status: Home / Self Care | Source: Ambulatory Visit | Attending: Gastroenterology | Admitting: Gastroenterology

## 2024-03-02 DIAGNOSIS — R188 Other ascites: Secondary | ICD-10-CM | POA: Diagnosis not present

## 2024-03-02 DIAGNOSIS — K766 Portal hypertension: Secondary | ICD-10-CM | POA: Insufficient documentation

## 2024-03-02 DIAGNOSIS — K7460 Unspecified cirrhosis of liver: Secondary | ICD-10-CM | POA: Diagnosis present

## 2024-03-02 HISTORY — PX: IR PARACENTESIS: IMG2679

## 2024-03-02 LAB — GRAM STAIN

## 2024-03-02 LAB — BODY FLUID CELL COUNT WITH DIFFERENTIAL
Eos, Fluid: 0 %
Lymphs, Fluid: 58 %
Monocyte-Macrophage-Serous Fluid: 40 % — ABNORMAL LOW (ref 50–90)
Neutrophil Count, Fluid: 2 % (ref 0–25)
Total Nucleated Cell Count, Fluid: 82 uL (ref 0–1000)

## 2024-03-02 MED ORDER — LIDOCAINE-EPINEPHRINE 1 %-1:100000 IJ SOLN
INTRAMUSCULAR | Status: AC
  Filled 2024-03-02: qty 20

## 2024-03-02 MED ORDER — LIDOCAINE-EPINEPHRINE (PF) 1 %-1:200000 IJ SOLN
10.0000 mL | Freq: Once | INTRAMUSCULAR | Status: AC
  Administered 2024-03-02: 09:00:00 10 mL

## 2024-03-02 NOTE — Procedures (Signed)
 PROCEDURE SUMMARY:  Successful image-guided paracentesis from the left abdomen. 4 L max. Yielded 4.0 liters of hazy, straw-colored fluid.  No immediate complications.  EBL: zero Patient tolerated well.   Specimen was sent for labs.  Please see imaging section of Epic for full dictation.  Carlin LABOR Alexandro Line PA-C 03/02/2024 8:58 AM

## 2024-03-03 LAB — PROTEIN, BODY FLUID (OTHER): Total Protein, Body Fluid Other: 1.1 g/dL

## 2024-03-03 LAB — HEPATITIS B SURFACE ANTIBODY,QUALITATIVE: Hep B S Ab: NONREACTIVE

## 2024-03-03 LAB — TISSUE TRANSGLUTAMINASE, IGA: (tTG) Ab, IgA: <1 U/mL

## 2024-03-03 LAB — ALPHA-GAL PANEL
Allergen, Mutton, f88: 6.48 kU/L — ABNORMAL HIGH
Allergen, Pork, f26: 8.39 kU/L — ABNORMAL HIGH
Beef: 20 kU/L — ABNORMAL HIGH
CLASS: 3
CLASS: 4
Class: 3
GALACTOSE-ALPHA-1,3-GALACTOSE IGE*: 41.3 kU/L — ABNORMAL HIGH

## 2024-03-03 LAB — IGA: Immunoglobulin A: 1099 mg/dL — ABNORMAL HIGH (ref 47–310)

## 2024-03-03 LAB — PREALBUMIN: Prealbumin: 9 mg/dL — ABNORMAL LOW (ref 21–43)

## 2024-03-03 LAB — CERULOPLASMIN: Ceruloplasmin: 23 mg/dL (ref 14–30)

## 2024-03-03 LAB — MITOCHONDRIAL ANTIBODIES: Mitochondrial M2 Ab, IgG: <=20 U (ref <=20.0)

## 2024-03-03 LAB — ALBUMIN, FLUID (OTHER): Albumin, Body Fluid Other: 0.6 g/dL

## 2024-03-03 LAB — ANTI-SMOOTH MUSCLE ANTIBODY, IGG: Actin (Smooth Muscle) Antibody (IGG): <20 U (ref <20)

## 2024-03-03 LAB — INTERPRETATION:

## 2024-03-03 LAB — IGG: IgG (Immunoglobin G), Serum: 1937 mg/dL — ABNORMAL HIGH (ref 600–1640)

## 2024-03-03 LAB — CYTOLOGY - NON PAP

## 2024-03-03 LAB — AFP TUMOR MARKER: AFP-Tumor Marker: 4.9 ng/mL (ref <6.1)

## 2024-03-03 LAB — HEPATITIS B SURFACE ANTIGEN: Hepatitis B Surface Ag: NONREACTIVE

## 2024-03-03 LAB — ALPHA-1-ANTITRYPSIN: A-1 Antitrypsin, Ser: 182 mg/dL (ref 83–199)

## 2024-03-03 LAB — HEPATITIS C ANTIBODY: Hepatitis C Ab: NONREACTIVE

## 2024-03-06 ENCOUNTER — Ambulatory Visit: Payer: Self-pay | Admitting: Gastroenterology

## 2024-03-06 ENCOUNTER — Encounter: Payer: Self-pay | Admitting: Gastroenterology

## 2024-03-06 NOTE — Progress Notes (Signed)
 Mr. Rosato, Your lab results show the following:  The alpha-gal panel was markedly positive.  This indicates that you need to be much more strict about avoiding mammalian products (beef, pork, lamb) from your diet.  I think you will feel a lot better when you completely eliminate these meats from your diet.  Keep in mind, it may take a few weeks before your symptoms to improve after making these dietary changes.  I would also recommend eliminating dairy for a while.  You can consider reintroducing dairy after a month or so if your symptoms improve.  Your prealbumin level was quite low.  This is an indicator of poor nutrition.  Hopefully your nutrition will improve as your GI symptoms improved.  I think that most of your GI symptoms are related to alpha-gal syndrome.  Eliminating mammalian products from your diet should help your oral intake and therefore help your nutritional status as well.  You had elevated levels of IgG and IgA antibodies.  This is commonly seen in cirrhosis.  Your vitamin D  level was slightly low.  You may benefit from vitamin D  supplements.  We will prescribe a vitamin D  capsule to take once a week for 12 weeks.  Your liver enzymes indicate ongoing inflammation in the liver.  Please continue to completely abstain from alcohol.  Your iron levels were essentially normal.  Your B12 and folate levels were normal.  Your INR was elevated.  The INR is a measure of your body's ability to clot blood.  When it is elevated, that means the body does not have normal levels of proteins involved in the clotting process. As discussed in the office, this indicates more severe liver dysfunction, as the liver is responsible for making those proteins.  Your antibodies to hepatitis C and hepatitis B were unreactive, meaning you are not infected.  Additionally, your hepatitis B test indicated that you are not immune to hepatitis B.  It is recommended that everyone with chronic liver disease get  vaccinated against hepatitis B. We can provide this immunization in our office.  This can wait until your next follow-up with me if he like.  All of the other test looking for autoimmune and genetic causes of liver disease were normal/negative.  This makes metabolic liver disease the most likely cause of your cirrhosis, in addition to the history of prior alcohol use.    Team, Please place a prescription for ergocalciferol  50,000 units.   Take 1 capsule by mouth once a week for 12 weeks #12 RF 0

## 2024-03-06 NOTE — Progress Notes (Signed)
 Gerald Larsen, The lab test from the fluid removed or abdomen are consistent with ascites from cirrhosis.  There is no evidence of infection and no evidence of any cancerous cells. Please continue to take the medications (Lasix , Aldactone ) that help mobilize the fluid and to follow a low salt diet.  These things will help keep the fluid off.

## 2024-03-07 ENCOUNTER — Other Ambulatory Visit: Payer: Self-pay

## 2024-03-07 MED ORDER — VITAMIN D (ERGOCALCIFEROL) 1.25 MG (50000 UNIT) PO CAPS: 50000.0000 [IU] | ORAL_CAPSULE | ORAL | 0 refills | Status: AC

## 2024-03-07 NOTE — Telephone Encounter (Signed)
 See additional notes, pt aware.

## 2024-03-12 ENCOUNTER — Encounter: Payer: Self-pay | Admitting: Family Medicine

## 2024-03-13 ENCOUNTER — Encounter: Payer: Self-pay | Admitting: Gastroenterology

## 2024-03-14 NOTE — Telephone Encounter (Signed)
 I will await your response on what meds you want Garrel to be on before responding to pt wife.

## 2024-03-17 ENCOUNTER — Other Ambulatory Visit: Payer: Self-pay | Admitting: Family Medicine

## 2024-03-17 MED ORDER — LISINOPRIL-HYDROCHLOROTHIAZIDE 20-12.5 MG PO TABS: 1.0000 | ORAL_TABLET | Freq: Every day | ORAL | 1 refills | Status: DC

## 2024-03-17 MED ORDER — PRAVASTATIN SODIUM 40 MG PO TABS: 40.0000 mg | ORAL_TABLET | Freq: Every day | ORAL | 1 refills | Status: AC

## 2024-03-24 ENCOUNTER — Encounter: Payer: Self-pay | Admitting: Gastroenterology

## 2024-03-31 ENCOUNTER — Encounter: Payer: Self-pay | Admitting: Gastroenterology

## 2024-03-31 ENCOUNTER — Ambulatory Visit: Admitting: Gastroenterology

## 2024-03-31 VITALS — BP 126/90 | HR 100 | Temp 97.4°F | Resp 17 | Ht 72.0 in | Wt 228.0 lb

## 2024-03-31 DIAGNOSIS — K7460 Unspecified cirrhosis of liver: Secondary | ICD-10-CM

## 2024-03-31 DIAGNOSIS — I8500 Esophageal varices without bleeding: Secondary | ICD-10-CM

## 2024-03-31 DIAGNOSIS — K2100 Gastro-esophageal reflux disease with esophagitis, without bleeding: Secondary | ICD-10-CM | POA: Diagnosis not present

## 2024-03-31 DIAGNOSIS — K2289 Other specified disease of esophagus: Secondary | ICD-10-CM

## 2024-03-31 DIAGNOSIS — K3189 Other diseases of stomach and duodenum: Secondary | ICD-10-CM | POA: Diagnosis not present

## 2024-03-31 DIAGNOSIS — K31A0 Gastric intestinal metaplasia, unspecified: Secondary | ICD-10-CM | POA: Diagnosis not present

## 2024-03-31 DIAGNOSIS — I8510 Secondary esophageal varices without bleeding: Secondary | ICD-10-CM

## 2024-03-31 MED ORDER — SODIUM CHLORIDE 0.9 % IV SOLN: 500.0000 mL | INTRAVENOUS | Status: DC

## 2024-03-31 MED ORDER — CARVEDILOL 6.25 MG PO TABS: 6.2500 mg | ORAL_TABLET | Freq: Two times a day (BID) | ORAL | 3 refills | Status: AC

## 2024-03-31 NOTE — Op Note (Signed)
 [facility] Patient Name: Gerald Larsen Procedure Date: 03/31/2024 9:50 AM MRN: [id number] Endoscopist: Glendia E. Stacia , MD, [id number] Age: 59 Referring MD:  Date of Birth: [birth year] Gender: Male Account #: [id number] Procedure:                Upper GI endoscopy Indications:              Cirrhosis rule out esophageal varices Medicines:                Monitored Anesthesia Care Procedure:                Pre-Anesthesia Assessment:                           - Prior to the procedure, a History and Physical                            was performed, and patient medications and                            allergies were reviewed. The patient's tolerance of                            previous anesthesia was also reviewed. The risks                            and benefits of the procedure and the sedation                            options and risks were discussed with the patient.                            All questions were answered, and informed consent                            was obtained. Prior Anticoagulants: The patient has                            taken no anticoagulant or antiplatelet agents. ASA                            Grade Assessment: III - A patient with severe                            systemic disease. After reviewing the risks and                            benefits, the patient was deemed in satisfactory                            condition to undergo the procedure.                           After obtaining informed consent, the endoscope was  passed under direct vision. Throughout the                            procedure, the patient's blood pressure, pulse, and                            oxygen saturations were monitored continuously. The                            GIF [id number] #[id number] was introduced through the                            mouth, and advanced to the second part of duodenum.                            The  upper GI endoscopy was accomplished without                            difficulty. The patient tolerated the procedure                            well. Scope In: Scope Out: Findings:                 The examined portions of the nasopharynx,                            oropharynx and larynx were normal.                           Grade II varices were found in the lower third of                            the esophagus. They were medium in size.                           LA Grade A (one or more mucosal breaks less than 5                            mm, not extending between tops of 2 mucosal folds)                            esophagitis with no bleeding was found.                           One tongue of salmon-colored mucosa was present. No                            other visible abnormalities were present. The                            maximum longitudinal extent of these esophageal  mucosal changes was 0.5 cm in length.                           Mild portal hypertensive gastropathy was found in                            the entire examined stomach.                           The exam of the stomach was otherwise normal.                           Two 4 to 5 mm sessile polyps with no bleeding were                            found in the duodenal bulb. Biopsies were taken                            with a cold forceps for histology. Estimated blood                            loss was minimal.                           The exam of the duodenum was otherwise normal. Complications:            No immediate complications. Estimated Blood Loss:     Estimated blood loss was minimal. Impression:               - The examined portions of the nasopharynx,                            oropharynx and larynx were normal.                           - Grade II esophageal varices.                           - LA Grade A reflux esophagitis with no bleeding.                           -  Salmon-colored mucosa.                           - Portal hypertensive gastropathy.                           - Two duodenal polyps. Biopsied. Recommendation:           - Patient has a contact number available for                            emergencies. The signs and symptoms of potential                            delayed complications were discussed with the  patient. Return to normal activities tomorrow.                            Written discharge instructions were provided to the                            patient.                           - Continue low sodium diet.                           - Start carvedilol  6.25 mg twice daily #180 rf3 to                            reduce risk of esophageal variceal bleeding.                           - Return to GI office at the next available                            appointment.                           - Continue to completely abstain from alcohol. Markus Casten E. Stacia, MD 03/31/2024 10:21:38 AM This report has been signed electronically.

## 2024-03-31 NOTE — Progress Notes (Signed)
 Called to room to assist during endoscopic procedure.  Patient ID and intended procedure confirmed with present staff. Received instructions for my participation in the procedure from the performing physician.

## 2024-03-31 NOTE — Patient Instructions (Signed)
 YOU HAD AN ENDOSCOPIC PROCEDURE TODAY AT THE [facility]:   Refer to the procedure report that was given to you for any specific questions about what was found during the examination.  If the procedure report does not answer your questions, please call your gastroenterologist to clarify.  If you requested that your care partner not be given the details of your procedure findings, then the procedure report has been included in a sealed envelope for you to review at your convenience later.  YOU SHOULD EXPECT: Some feelings of bloating in the abdomen. Passage of more gas than usual.  Walking can help get rid of the air that was put into your GI tract during the procedure and reduce the bloating. If you had a lower endoscopy (such as a colonoscopy or flexible sigmoidoscopy) you may notice spotting of blood in your stool or on the toilet paper. If you underwent a bowel prep for your procedure, you may not have a normal bowel movement for a few days.  Please Note:  You might notice some irritation and congestion in your nose or some drainage.  This is from the oxygen used during your procedure.  There is no need for concern and it should clear up in a day or so.  SYMPTOMS TO REPORT IMMEDIATELY:   Following upper endoscopy (EGD)  Vomiting of blood or coffee ground material  New chest pain or pain under the shoulder blades  Painful or persistently difficult swallowing  New shortness of breath  Fever of 100F or higher  Black, tarry-looking stools  Continue low sodium diet Start carvedilol  - 6.25 mg twice daily to reduce risk of esophageal variceal bleeding Continue to completely abstain from alcohol   For urgent or emergent issues, a gastroenterologist can be reached at any hour by calling [phone]. Do not use MyChart messaging for urgent concerns.    DIET:  We do recommend a small meal at first, but then you may proceed to your regular diet.  Drink plenty of fluids but you  should avoid alcoholic beverages for 24 hours.  ACTIVITY:  You should plan to take it easy for the rest of today and you should NOT DRIVE or use heavy machinery until tomorrow (because of the sedation medicines used during the test).    FOLLOW UP: Our staff will call the number listed on your records the next business day following your procedure.  We will call around 7:15- 8:00 am to check on you and address any questions or concerns that you may have regarding the information given to you following your procedure. If we do not reach you, we will leave a message.     If any biopsies were taken you will be contacted by phone or by letter within the next 1-3 weeks.  Please call us  at [phone] if you have not heard about the biopsies in 3 weeks.    SIGNATURES/CONFIDENTIALITY: You and/or your care partner have signed paperwork which will be entered into your electronic medical record.  These signatures attest to the fact that that the information above on your After Visit Summary has been reviewed and is understood.  Full responsibility of the confidentiality of this discharge information lies with you and/or your care-partner.

## 2024-03-31 NOTE — Progress Notes (Signed)
 Sedate, gd SR, tolerated procedure well, VSS, report to RN

## 2024-03-31 NOTE — Progress Notes (Signed)
 [facility] History and Physical   Primary Care Physician:  Joyce Norleen BROCKS, MD   Reason for Procedure:   Variceal screening  Plan:    EGD   HPI: Gerald Larsen is a 59 y.o. male  with recently diagnosed cirrhosis undergoing EGD to screen for esophageal varices.   He has chronic GERD symptoms controlled with omeprazole .  He has alpha gal syndrome with ongoing abdominal pain, nausea/vomiting and diarrhea, symptoms have improved since adhering to a more strict diet.  MELD 3.0: 17 at 02/28/2024  4:18 PM MELD-Na: 18 at 02/28/2024  4:18 PM Calculated from: Serum Creatinine: 0.87 mg/dL (Using min of 1 mg/dL) at 87/84/7974  5:81 PM Serum Sodium: 136 mEq/L at 02/28/2024  4:18 PM Total Bilirubin: 3.6 mg/dL at 87/84/7974  5:81 PM Serum Albumin: 3.5 g/dL at 87/84/7974  5:81 PM INR(ratio): 1.7 ratio at 02/28/2024  4:18 PM Age at listing (hypothetical): 58 years Sex: Male at 02/28/2024  4:18 PM     The patient was provided an opportunity to ask questions and all were answered. The patient agreed with the plan   Past Medical History:  Diagnosis Date   Acute meniscal tear of left knee    Anxiety    Arthritis    oa   Chipped tooth 05/05/2021   1 on front upper 1 on front lower per pt   GERD (gastroesophageal reflux disease)    Gout    last flare up 04-28-2021   history of Migraines    none in last 4 to 6 months per pt on 05-05-2021   Hyperlipidemia 05/05/2021   no current medication taken   Hypertension    Hypogonadism male    OSA on CPAP    uses cpap does not know settings   Pre-diabetes    checks cbg runs 115 to 118 per pt   Uses brace 05/05/2021   wears right knee brace   Wears contact lenses     Past Surgical History:  Procedure Laterality Date   5 major hand surgeries to right hand age 55     put hand thru coke glass bottle   CARDIAC CATHETERIZATION  03/17/2011   cardiac cath 5/15.   Normal results per patient   CARPAL TUNNEL RELEASE Right 05/08/2021    Procedure: CARPAL TUNNEL RELEASE;  Surgeon: Alyse Agent, MD;  Location: [facility];  Service: Orthopedics;  Laterality: Right;  with local   colonscopy     6 months ago and age 65 per pt on 05-05-2021   EXAM UNDER ANESTHESIA WITH MANIPULATION OF SHOULDER Left 03/12/2014   Procedure: EXAM UNDER ANESTHESIA WITH MANIPULATION OF SHOULDER;  Surgeon: Reyes BROCKS Billing, MD;  Location: [facility];  Service: Orthopedics;  Laterality: Left;   IR PARACENTESIS  03/02/2024   KNEE ARTHROSCOPY Bilateral 11/14/1988   KNEE ARTHROSCOPY Right 10/25/2019   Procedure: ARTHROSCOPY KNEE AND DEBRIDEMENT;  Surgeon: Billing Reyes, MD;  Location: [facility];  Service: Orthopedics;  Laterality: Right;   KNEE ARTHROSCOPY     per pt on 05-05-2021 total of 4 knee surgeries on 1 side and 5 knee surgeries on other side   KNEE ARTHROSCOPY WITH MEDIAL MENISECTOMY Left 03/24/2013   Procedure: LEFT KNEE ARTHROSCOPY WITH PARTIAL MEDIAL MENISECTOMY, EUA/MUA;  Surgeon: Reyes BROCKS Billing, MD;  Location: [facility];  Service: Orthopedics;  Laterality: Left;   LAPAROSCOPIC CHOLECYSTECTOMY  01/17/2004   LUMBAR DISC SURGERY  03/16/1998   rigth shoulder rotator cuff repair Right 2016   SHOULDER  ARTHROSCOPY WITH ROTATOR CUFF REPAIR AND SUBACROMIAL DECOMPRESSION Left 03/12/2014   Procedure: LEFT SHOULDER ARTHROSCOPY WITH DISTAL CLAVICLE RESECTION/SUBACROMIAL DECOMPRESSION/LABRAL DEBRIDEMENT/ROTATOR CUFF DEBRIDEMENT/SUBACROMIAL RESECTION;  Surgeon: Reyes JAYSON Billing, MD;  Location: [facility];  Service: Orthopedics;  Laterality: Left;    Prior to Admission medications  Medication Sig Start Date End Date Taking? Authorizing Provider  furosemide  (LASIX ) 40 MG tablet Take 1 tablet (40 mg total) by mouth daily. 02/24/24  Yes Joyce Norleen JAYSON, MD  lisinopril -hydrochlorothiazide  (ZESTORETIC ) 20-12.5 MG tablet Take 1 tablet by mouth daily. 03/17/24  Yes Joyce Norleen JAYSON, MD  pravastatin  (PRAVACHOL ) 40 MG tablet Take 1 tablet (40 mg  total) by mouth daily. 03/17/24  Yes Joyce Norleen JAYSON, MD  spironolactone  (ALDACTONE ) 50 MG tablet Take 1 tablet (50 mg total) by mouth 2 (two) times daily. 02/24/24  Yes Joyce Norleen JAYSON, MD  Vitamin D , Ergocalciferol , (DRISDOL ) 1.25 MG (50000 UNIT) CAPS capsule Take 1 capsule (50,000 Units total) by mouth every 7 (seven) days. 03/07/24  Yes Stacia Glendia BRAVO, MD    Current Outpatient Medications  Medication Sig Dispense Refill   furosemide  (LASIX ) 40 MG tablet Take 1 tablet (40 mg total) by mouth daily. 30 tablet 3   lisinopril -hydrochlorothiazide  (ZESTORETIC ) 20-12.5 MG tablet Take 1 tablet by mouth daily. 90 tablet 1   pravastatin  (PRAVACHOL ) 40 MG tablet Take 1 tablet (40 mg total) by mouth daily. 90 tablet 1   spironolactone  (ALDACTONE ) 50 MG tablet Take 1 tablet (50 mg total) by mouth 2 (two) times daily. 60 tablet 5   Vitamin D , Ergocalciferol , (DRISDOL ) 1.25 MG (50000 UNIT) CAPS capsule Take 1 capsule (50,000 Units total) by mouth every 7 (seven) days. 12 capsule 0   Current Facility-Administered Medications  Medication Dose Route Frequency Provider Last Rate Last Admin   0.9 %  sodium chloride  infusion  500 mL Intravenous Continuous Stacia Glendia BRAVO, MD        Allergies as of 03/31/2024   (No Known Allergies)    Family History  Problem Relation Age of Onset   Arthritis Mother    Heart disease Mother    Diabetes Mother    Depression Mother    Arthritis Father    Heart disease Father    Colon cancer Neg Hx     Social History   Socioeconomic History   Marital status: Married    Spouse name: Not on file   Number of children: Not on file   Years of education: Not on file   Highest education level: Not on file  Occupational History   Not on file  Tobacco Use   Smoking status: Former    Current packs/day: 0.00    Average packs/day: 1 pack/day for 10.0 years (10.0 ttl pk-yrs)    Types: Cigarettes    Start date: 10/28/1978    Quit date: 10/27/1988    Years since  quitting: 35.4   Smokeless tobacco: Never  Vaping Use   Vaping status: Never Used  Substance and Sexual Activity   Alcohol use: Yes    Comment: rare   Drug use: No    Comment: hx of marijuana use as a teenager    Sexual activity: Not Currently  Other Topics Concern   Not on file  Social History Narrative   Not on file   Social Drivers of Health   Tobacco Use: Medium Risk (03/31/2024)   Patient History    Smoking Tobacco Use: Former    Smokeless Tobacco Use: Never    Passive  Exposure: Not on file  Financial Resource Strain: Not on file  Food Insecurity: Not on file  Transportation Needs: Not on file  Physical Activity: Not on file  Stress: Not on file  Social Connections: Not on file  Intimate Partner Violence: Not on file  Depression (PHQ2-9): Low Risk (08/13/2021)   Depression (PHQ2-9)    PHQ-2 Score: 0  Alcohol Screen: Not on file  Housing: Not on file  Utilities: Not on file  Health Literacy: Not on file    Review of Systems:  All other review of systems negative except as mentioned in the HPI.  Physical Exam: Vital signs BP (!) 148/105   Pulse 97   Temp (!) 97.4 F (36.3 C) (Temporal)   Ht 6' (1.829 m)   Wt 228 lb (103.4 kg)   SpO2 99%   BMI 30.92 kg/m   General:   Alert,  Well-developed, well-nourished, pleasant and cooperative in NAD Airway:  Mallampati 2 Lungs:  Clear throughout to auscultation.   Heart:  Regular rate and rhythm; no murmurs, clicks, rubs,  or gallops. Abdomen:  Soft, nontender and nondistended. Normal bowel sounds.   Neuro/Psych:  Normal mood and affect. A and O x 3   Regene Mccarthy E. Stacia, MD [facility]

## 2024-03-31 NOTE — Progress Notes (Signed)
 Pt's states no medical or surgical changes since previsit or office visit.

## 2024-04-03 ENCOUNTER — Telehealth: Payer: Self-pay | Admitting: *Deleted

## 2024-04-03 NOTE — Telephone Encounter (Signed)
" °  Follow up Call-     03/31/2024    8:33 AM  Call back number  Post procedure Call Back phone  # [phone]  Permission to leave phone message Yes   Left message to call back if any questions or concerns "

## 2024-04-04 ENCOUNTER — Encounter: Payer: Self-pay | Admitting: Family Medicine

## 2024-04-04 ENCOUNTER — Ambulatory Visit: Admitting: Family Medicine

## 2024-04-04 VITALS — BP 98/58 | HR 65 | Ht 72.0 in | Wt 203.8 lb

## 2024-04-04 DIAGNOSIS — G4730 Sleep apnea, unspecified: Secondary | ICD-10-CM | POA: Diagnosis not present

## 2024-04-04 DIAGNOSIS — I10 Essential (primary) hypertension: Secondary | ICD-10-CM

## 2024-04-04 DIAGNOSIS — E785 Hyperlipidemia, unspecified: Secondary | ICD-10-CM | POA: Diagnosis not present

## 2024-04-04 DIAGNOSIS — Z6841 Body Mass Index (BMI) 40.0 and over, adult: Secondary | ICD-10-CM | POA: Diagnosis not present

## 2024-04-04 DIAGNOSIS — K7460 Unspecified cirrhosis of liver: Secondary | ICD-10-CM | POA: Diagnosis not present

## 2024-04-04 DIAGNOSIS — E118 Type 2 diabetes mellitus with unspecified complications: Secondary | ICD-10-CM

## 2024-04-04 DIAGNOSIS — Z91014 Allergy to mammalian meats: Secondary | ICD-10-CM

## 2024-04-04 DIAGNOSIS — I8510 Secondary esophageal varices without bleeding: Secondary | ICD-10-CM

## 2024-04-04 DIAGNOSIS — E1169 Type 2 diabetes mellitus with other specified complication: Secondary | ICD-10-CM

## 2024-04-04 LAB — COMPREHENSIVE METABOLIC PANEL WITH GFR
ALT: 44 IU/L (ref 0–44)
AST: 87 IU/L — ABNORMAL HIGH (ref 0–40)
Albumin: 3 g/dL — ABNORMAL LOW (ref 3.8–4.9)
Alkaline Phosphatase: 165 IU/L — ABNORMAL HIGH (ref 47–123)
BUN/Creatinine Ratio: 15 (ref 10–24)
BUN: 15 mg/dL (ref 8–27)
Bilirubin Total: 2.1 mg/dL — ABNORMAL HIGH (ref 0.0–1.2)
CO2: 20 mmol/L (ref 20–29)
Calcium: 9.1 mg/dL (ref 8.6–10.2)
Chloride: 96 mmol/L (ref 96–106)
Creatinine, Ser: 0.97 mg/dL (ref 0.76–1.27)
Globulin, Total: 3.7 g/dL (ref 1.5–4.5)
Glucose: 100 mg/dL — ABNORMAL HIGH (ref 70–99)
Potassium: 5 mmol/L (ref 3.5–5.2)
Sodium: 125 mmol/L — ABNORMAL LOW (ref 134–144)
Total Protein: 6.7 g/dL (ref 6.0–8.5)
eGFR: 89 mL/min/1.73

## 2024-04-04 LAB — CBC WITH DIFFERENTIAL/PLATELET
Basophils Absolute: 0.1 x10E3/uL (ref 0.0–0.2)
Basos: 1 %
EOS (ABSOLUTE): 0.2 x10E3/uL (ref 0.0–0.4)
Eos: 3 %
Hematocrit: 36.6 % — ABNORMAL LOW (ref 37.5–51.0)
Hemoglobin: 12.5 g/dL — ABNORMAL LOW (ref 13.0–17.7)
Immature Grans (Abs): 0 x10E3/uL (ref 0.0–0.1)
Immature Granulocytes: 0 %
Lymphocytes Absolute: 1.8 x10E3/uL (ref 0.7–3.1)
Lymphs: 26 %
MCH: 32.1 pg (ref 26.6–33.0)
MCHC: 34.2 g/dL (ref 31.5–35.7)
MCV: 94 fL (ref 79–97)
Monocytes Absolute: 0.6 x10E3/uL (ref 0.1–0.9)
Monocytes: 8 %
Neutrophils Absolute: 4.2 x10E3/uL (ref 1.4–7.0)
Neutrophils: 62 %
Platelets: 127 x10E3/uL — ABNORMAL LOW (ref 150–450)
RBC: 3.9 x10E6/uL — ABNORMAL LOW (ref 4.14–5.80)
RDW: 13.1 % (ref 11.6–15.4)
WBC: 6.8 x10E3/uL (ref 3.4–10.8)

## 2024-04-04 LAB — SURGICAL PATHOLOGY
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
## 2024-04-05 ENCOUNTER — Ambulatory Visit: Payer: Self-pay | Admitting: Gastroenterology

## 2024-04-05 ENCOUNTER — Ambulatory Visit: Payer: Self-pay | Admitting: Family Medicine

## 2024-04-05 DIAGNOSIS — E871 Hypo-osmolality and hyponatremia: Secondary | ICD-10-CM

## 2024-04-05 NOTE — Progress Notes (Signed)
 Gerald Larsen,  The biopsies of the small polyps in your duodenum did not show any precancerous changes.  These polyps are the results of inflammation, usually secondary to stomach acid.  The biopsies taken from your stomach were notable for mild reactive gastropathy which is a common finding and often related to use of certain medications (usually NSAIDs), but there was no evidence of Helicobacter pylori infection. This common finding is not felt to necessarily be a cause of any particular symptom and there is no specific treatment or further evaluation recommended.

## 2024-04-18 ENCOUNTER — Ambulatory Visit: Admitting: Family Medicine

## 2024-04-18 ENCOUNTER — Other Ambulatory Visit

## 2024-04-18 ENCOUNTER — Encounter: Payer: Self-pay | Admitting: Family Medicine

## 2024-04-18 VITALS — BP 122/70 | HR 59 | Wt 194.2 lb

## 2024-04-18 DIAGNOSIS — E871 Hypo-osmolality and hyponatremia: Secondary | ICD-10-CM | POA: Diagnosis not present

## 2024-04-18 DIAGNOSIS — R208 Other disturbances of skin sensation: Secondary | ICD-10-CM | POA: Diagnosis not present

## 2024-04-18 LAB — BASIC METABOLIC PANEL WITH GFR
BUN/Creatinine Ratio: 12 (ref 10–24)
BUN: 12 mg/dL (ref 8–27)
CO2: 20 mmol/L (ref 20–29)
Calcium: 9.3 mg/dL (ref 8.6–10.2)
Chloride: 103 mmol/L (ref 96–106)
Creatinine, Ser: 0.99 mg/dL (ref 0.76–1.27)
Glucose: 111 mg/dL — ABNORMAL HIGH (ref 70–99)
Potassium: 4.1 mmol/L (ref 3.5–5.2)
Sodium: 136 mmol/L (ref 134–144)
eGFR: 87 mL/min/{1.73_m2}

## 2024-04-18 MED ORDER — AMITRIPTYLINE HCL 10 MG PO TABS: 10.0000 mg | ORAL_TABLET | Freq: Every day | ORAL | 1 refills | Status: AC

## 2024-04-19 ENCOUNTER — Ambulatory Visit: Payer: Self-pay | Admitting: Family Medicine

## 2024-04-19 ENCOUNTER — Other Ambulatory Visit

## 2024-05-01 ENCOUNTER — Ambulatory Visit

## 2024-05-03 ENCOUNTER — Ambulatory Visit: Admitting: Gastroenterology
